# Patient Record
Sex: Female | Born: 1940
Health system: Southern US, Community
[De-identification: ages and names within clinical notes are randomized; demographics above are authoritative.]

## PROBLEM LIST (undated history)

## (undated) DIAGNOSIS — F32A Depression, unspecified: Secondary | ICD-10-CM

## (undated) DIAGNOSIS — K219 Gastro-esophageal reflux disease without esophagitis: Secondary | ICD-10-CM

## (undated) DIAGNOSIS — I10 Essential (primary) hypertension: Secondary | ICD-10-CM

## (undated) DIAGNOSIS — F329 Major depressive disorder, single episode, unspecified: Secondary | ICD-10-CM

## (undated) DIAGNOSIS — Z862 Personal history of diseases of the blood and blood-forming organs and certain disorders involving the immune mechanism: Secondary | ICD-10-CM

## (undated) DIAGNOSIS — E669 Obesity, unspecified: Secondary | ICD-10-CM

## (undated) DIAGNOSIS — E1165 Type 2 diabetes mellitus with hyperglycemia: Secondary | ICD-10-CM

## (undated) DIAGNOSIS — E7800 Pure hypercholesterolemia, unspecified: Secondary | ICD-10-CM

## (undated) DIAGNOSIS — K5790 Diverticulosis of intestine, part unspecified, without perforation or abscess without bleeding: Secondary | ICD-10-CM

## (undated) DIAGNOSIS — I493 Ventricular premature depolarization: Secondary | ICD-10-CM

## (undated) DIAGNOSIS — IMO0001 Reserved for inherently not codable concepts without codable children: Secondary | ICD-10-CM

## (undated) HISTORY — DX: Ventricular premature depolarization: I49.3

## (undated) HISTORY — PX: ABDOMINAL HYSTERECTOMY: SHX81

## (undated) HISTORY — DX: Depression, unspecified: F32.A

## (undated) HISTORY — PX: APPENDECTOMY: SHX54

## (undated) HISTORY — PX: CHOLECYSTECTOMY: SHX55

## (undated) HISTORY — DX: Essential (primary) hypertension: I10

## (undated) HISTORY — DX: Personal history of diseases of the blood and blood-forming organs and certain disorders involving the immune mechanism: Z86.2

## (undated) HISTORY — DX: Gastro-esophageal reflux disease without esophagitis: K21.9

## (undated) HISTORY — DX: Reserved for inherently not codable concepts without codable children: IMO0001

## (undated) HISTORY — DX: Obesity, unspecified: E66.9

## (undated) HISTORY — DX: Pure hypercholesterolemia, unspecified: E78.00

## (undated) HISTORY — DX: Diverticulosis of intestine, part unspecified, without perforation or abscess without bleeding: K57.90

## (undated) HISTORY — PX: OTHER SURGICAL HISTORY: SHX169

## (undated) HISTORY — DX: Major depressive disorder, single episode, unspecified: F32.9

## (undated) HISTORY — DX: Type 2 diabetes mellitus with hyperglycemia: E11.65

---

## 1999-03-29 ENCOUNTER — Ambulatory Visit (HOSPITAL_COMMUNITY): Admission: RE | Admit: 1999-03-29 | Discharge: 1999-03-29 | Payer: Self-pay | Admitting: Orthopedic Surgery

## 1999-04-27 ENCOUNTER — Ambulatory Visit (HOSPITAL_BASED_OUTPATIENT_CLINIC_OR_DEPARTMENT_OTHER): Admission: RE | Admit: 1999-04-27 | Discharge: 1999-04-27 | Payer: Self-pay | Admitting: Orthopedic Surgery

## 1999-06-18 ENCOUNTER — Other Ambulatory Visit: Admission: RE | Admit: 1999-06-18 | Discharge: 1999-06-18 | Payer: Self-pay | Admitting: Obstetrics and Gynecology

## 1999-07-03 ENCOUNTER — Encounter: Payer: Self-pay | Admitting: Cardiology

## 1999-07-03 ENCOUNTER — Encounter: Admission: RE | Admit: 1999-07-03 | Discharge: 1999-07-03 | Payer: Self-pay | Admitting: Cardiology

## 1999-07-11 ENCOUNTER — Encounter: Payer: Self-pay | Admitting: Gastroenterology

## 1999-07-11 ENCOUNTER — Ambulatory Visit (HOSPITAL_COMMUNITY): Admission: RE | Admit: 1999-07-11 | Discharge: 1999-07-11 | Payer: Self-pay | Admitting: Gastroenterology

## 1999-07-11 ENCOUNTER — Encounter: Payer: Self-pay | Admitting: Internal Medicine

## 1999-08-09 ENCOUNTER — Encounter: Payer: Self-pay | Admitting: Internal Medicine

## 2000-09-10 ENCOUNTER — Other Ambulatory Visit: Admission: RE | Admit: 2000-09-10 | Discharge: 2000-09-10 | Payer: Self-pay | Admitting: Obstetrics and Gynecology

## 2002-11-11 ENCOUNTER — Other Ambulatory Visit: Admission: RE | Admit: 2002-11-11 | Discharge: 2002-11-11 | Payer: Self-pay | Admitting: Obstetrics and Gynecology

## 2005-04-02 ENCOUNTER — Other Ambulatory Visit: Admission: RE | Admit: 2005-04-02 | Discharge: 2005-04-02 | Payer: Self-pay | Admitting: Obstetrics and Gynecology

## 2006-09-17 ENCOUNTER — Ambulatory Visit (HOSPITAL_BASED_OUTPATIENT_CLINIC_OR_DEPARTMENT_OTHER): Admission: RE | Admit: 2006-09-17 | Discharge: 2006-09-17 | Payer: Self-pay | Admitting: Orthopedic Surgery

## 2006-09-17 ENCOUNTER — Encounter (INDEPENDENT_AMBULATORY_CARE_PROVIDER_SITE_OTHER): Payer: Self-pay | Admitting: *Deleted

## 2008-08-11 LAB — HEMOGLOBIN A1C: Hgb A1c MFr Bld: 6.6 % — AB (ref 4.0–6.0)

## 2009-07-14 ENCOUNTER — Encounter (INDEPENDENT_AMBULATORY_CARE_PROVIDER_SITE_OTHER): Payer: Self-pay | Admitting: *Deleted

## 2009-12-08 DIAGNOSIS — I10 Essential (primary) hypertension: Secondary | ICD-10-CM | POA: Insufficient documentation

## 2009-12-08 DIAGNOSIS — K5730 Diverticulosis of large intestine without perforation or abscess without bleeding: Secondary | ICD-10-CM | POA: Insufficient documentation

## 2009-12-14 ENCOUNTER — Ambulatory Visit: Payer: Self-pay | Admitting: Internal Medicine

## 2010-01-24 ENCOUNTER — Ambulatory Visit: Payer: Self-pay | Admitting: Internal Medicine

## 2010-01-24 LAB — HM COLONOSCOPY

## 2010-01-25 ENCOUNTER — Encounter: Payer: Self-pay | Admitting: Internal Medicine

## 2010-04-05 ENCOUNTER — Ambulatory Visit: Payer: Self-pay | Admitting: Cardiology

## 2010-04-05 LAB — LIPID PANEL
Cholesterol: 167 mg/dL (ref 0–200)
HDL: 53 mg/dL (ref 35–70)
LDL Cholesterol: 89 mg/dL
Triglycerides: 123 mg/dL (ref 40–160)

## 2010-04-05 LAB — BASIC METABOLIC PANEL
BUN: 21 mg/dL (ref 4–21)
Glucose: 119 mg/dL

## 2010-04-06 ENCOUNTER — Encounter: Admission: RE | Admit: 2010-04-06 | Discharge: 2010-04-06 | Payer: Self-pay | Admitting: Cardiology

## 2010-07-16 ENCOUNTER — Encounter: Payer: Self-pay | Admitting: Cardiology

## 2010-07-16 DIAGNOSIS — Z862 Personal history of diseases of the blood and blood-forming organs and certain disorders involving the immune mechanism: Secondary | ICD-10-CM | POA: Insufficient documentation

## 2010-07-16 DIAGNOSIS — E1169 Type 2 diabetes mellitus with other specified complication: Secondary | ICD-10-CM | POA: Insufficient documentation

## 2010-07-16 DIAGNOSIS — E7800 Pure hypercholesterolemia, unspecified: Secondary | ICD-10-CM | POA: Insufficient documentation

## 2010-08-07 NOTE — Letter (Signed)
Summary: Patient [facility] Biopsy Results  [facility]  [address] [place], [place] [address]   Phone: [phone]  Fax: [phone]        January 25, 2010 MRN: [id number]    Elaine Oconnell [address] [place], [place]  [address]    Dear Ms. Christley,  I am pleased to inform you that the biopsies taken during your recent endoscopic examination did not show any evidence of cancer upon pathologic examination.  Additional information/recommendations:  __No further action is needed at this time.  Please follow-up with      your primary care physician for your other healthcare needs.  __ Please call [phone] to schedule a return visit to review      your condition.  _x_ Continue with the treatment plan as outlined on the day of your      exam.  _   Please call us if you are having persistent problems or have questions about your condition that have not been fully answered at this time.  Sincerely,  Hart Carwin MD  This letter has been electronically signed by your physician.  Appended Document: Patient Notice-Endo Biopsy Results letter mailed

## 2010-08-07 NOTE — Procedures (Signed)
Summary: Upper Endoscopy/[facility]  Upper Endoscopy/[facility]   Imported By: Sherian Rein 12/12/2009 11:47:14  _____________________________________________________________________  External Attachment:    Type:   Image     Comment:   External Document

## 2010-08-07 NOTE — Letter (Signed)
Summary: [facility] Instructions  [facility]  [address] [place], [place] [address]   Phone: [phone]  Fax: [phone]       KIMILA PAPALEO    08-05-1940    MRN: [id number]       Procedure Day /Date: 01/24/10 Wednesday     Arrival Time: 8:00 am     Procedure Time: 9:00 am     Location of Procedure:                    _x _  [facility] (4th Floor)  PREPARATION FOR COLONOSCOPY WITH MIRALAX  Starting 5 days prior to your procedure (01/19/10) do not eat nuts, seeds, popcorn, corn, beans, peas,  salads, or any raw vegetables.  Do not take any fiber supplements (e.g. Metamucil, Citrucel, and Benefiber). ____________________________________________________________________________________________________   THE DAY BEFORE YOUR PROCEDURE         DATE: 01/23/10 DAY: Tuesday  1   Drink clear liquids the entire day-NO SOLID FOOD  2   Do not drink anything colored red or purple.  Avoid juices with pulp.  No orange juice.  3   Drink at least 64 oz. (8 glasses) of fluid/clear liquids during the day to prevent dehydration and help the prep work efficiently.  CLEAR LIQUIDS INCLUDE: Water Jello Ice Popsicles Tea (sugar ok, no milk/cream) Powdered fruit flavored drinks Coffee (sugar ok, no milk/cream) Gatorade Juice: apple, white grape, white cranberry  Lemonade Clear bullion, consomm, broth Carbonated beverages (any kind) Strained chicken noodle soup Hard Candy  4   Mix the entire bottle of Miralax with 64 oz. of Gatorade/Powerade in the morning and put in the refrigerator to chill.  5   At 3:00 pm take 2 Dulcolax/Bisacodyl tablets.  6   At 4:30 pm take one Reglan/Metoclopramide tablet.  7  Starting at 5:00 pm drink one 8 oz glass of the Miralax mixture every 15-20 minutes until you have finished drinking the entire 64 oz.  You should finish drinking prep around 7:30 or 8:00 pm.  8   If you are nauseated, you may take the 2nd Reglan/Metoclopramide  tablet at 6:30 pm.        9    At 8:00 pm take 2 more DULCOLAX/Bisacodyl tablets.        THE DAY OF YOUR PROCEDURE      DATE:  01/24/10 DAY: Wednesday  You may drink clear liquids until 7:00 am  (2 HOURS BEFORE PROCEDURE).   MEDICATION INSTRUCTIONS  Unless otherwise instructed, you should take regular prescription medications with a small sip of water as early as possible the morning of your procedure.        OTHER INSTRUCTIONS  You will need a responsible adult at least 79 years of 72 years of age to accompany you and drive you home.   This person must remain in the waiting room during your procedure.  Wear loose fitting clothing that is easily removed.  Leave jewelry and other valuables at home.  However, you may wish to bring a book to read or an iPod/MP3 player to listen to music as you wait for your procedure to start.  Remove all body piercing jewelry and leave at home.  Total time from sign-in until discharge is approximately 2-3 hours.  You should go home directly after your procedure and rest.  You can resume normal activities the day after your procedure.  The day of your procedure you should not:   Drive   Make  legal decisions   Operate machinery   Drink alcohol   Return to work  You will receive specific instructions about eating, activities and medications before you leave.   The above instructions have been reviewed and explained to me by  Lamona Curl CMA Duncan Dull)  December 14, 2009 3:06 PM     I fully understand and can verbalize these instructions _____________________________ Date 12/14/09

## 2010-08-07 NOTE — Letter (Signed)
Summary: Patient Notice- Polyp Results  [facility]  [address] [place], [place] [address]   Phone: [phone]  Fax: [phone]        January 25, 2010 MRN: [id number]    KARELYN BRISBY [address] [place], [place]  [address]    Dear Ms. Freshour,  I am pleased to inform you that the colon polyp(s) removed during your recent colonoscopy was (were) found to be benign (no cancer detected) upon pathologic examination.There is no evidence of colitis.  I recommend you have a repeat colonoscopy examination in 10_ years to look for recurrent polyps, as having colon polyps increases your risk for having recurrent polyps or even colon cancer in the future.  Should you develop new or worsening symptoms of abdominal pain, bowel habit changes or bleeding from the rectum or bowels, please schedule an evaluation with either your primary care physician or with me.  Additional information/recommendations:  __x No further action with gastroenterology is needed at this time. Please      follow-up with your primary care physician for your other healthcare      needs.  __ Please call [phone] to schedule a return visit to review your      situation.  __ Please keep your follow-up visit as already scheduled.  _x_ Continue treatment plan as outlined the day of your exam.  Please call us if you are having persistent problems or have questions about your condition that have not been fully answered at this time.  Sincerely,  Hart Carwin MD  This letter has been electronically signed by your physician.  Appended Document: Patient Notice- Polyp Results letter mailed

## 2010-08-07 NOTE — Letter (Signed)
Summary: Diabetic Instructions  [facility]  [address] [place], [place] [address]   Phone: [phone]  Fax: [phone]    Elaine Oconnell 02/25/1941 MRN: [id number]   _ x _   ORAL DIABETIC MEDICATION INSTRUCTIONS  The day before your procedure:   Take your diabetic pill as you do normally  The day of your procedure:   Do not take your diabetic pill    We will check your blood sugar levels during the admission process and again in Recovery before discharging you home  ________________________________________________________________________

## 2010-08-07 NOTE — Letter (Signed)
Summary: Colonoscopy Letter  [facility]  [address] [place], [place] [address]   Phone: [phone]  Fax: [phone]      July 14, 2009 MRN: [id number]   ADYA WIRZ [address] [place], [place]  [address]   Dear Ms. Chia,   According to your medical record, it is time for you to schedule a Colonoscopy. The American Cancer Society recommends this procedure as a method to detect early colon cancer. Patients with a family history of colon cancer, or a personal history of colon polyps or inflammatory bowel disease are at increased risk.  This letter has beeen generated based on the recommendations made at the time of your procedure. If you feel that in your particular situation this may no longer apply, please contact our office.  Please call our office at [phone] to schedule this appointment or to update your records at your earliest convenience.  Thank you for cooperating with [place] to provide you with the very best care possible.   Sincerely,  Hedwig Morton. Juanda Chance, M.D.  [facility] [phone]

## 2010-08-07 NOTE — Progress Notes (Signed)
Summary: [facility]   Imported By: Sherian Rein 12/12/2009 11:44:51  _____________________________________________________________________  External Attachment:    Type:   Image     Comment:   External Document

## 2010-08-07 NOTE — Assessment & Plan Note (Signed)
Summary: DIARRHEA X 2 WKS...AS.   History of Present Illness Visit Type: Initial Visit Primary GI MD: Lina Sar MD Primary Provider: Cassell Clement, MD Chief Complaint: diarrhea on and off  History of Present Illness:   This is a 70 year old white female with acute diarrhea of 2 weeks duration getting much better in the last 2 weeks. The diarrhea started about a month ago and she was having watery stools 8-10 times a day without fever or rectal bleeding. She has irregular bowel habits, often having diarrhea after meals. A flexible sigmoidoscopy in 1994 for diverticulosis and a colonoscopy in 2001 confirmed the presence of diverticuli. She has a history of gastroesophageal reflux and mild gastritis on an upper endoscopy in February 2001. Her diarrhea has gotten better since taking Imodium. Other medical problems include hypertension and adult onset diabetes. She has been on metformin 1,000 mg once a day.   GI Review of Systems    Reports abdominal pain and  acid reflux.     Location of  Abdominal pain: lower abdomen.    Denies belching, bloating, chest pain, dysphagia with liquids, dysphagia with solids, heartburn, loss of appetite, nausea, vomiting, vomiting blood, weight loss, and  weight gain.      Reports diarrhea.     Denies anal fissure, black tarry stools, change in bowel habit, constipation, diverticulosis, fecal incontinence, heme positive stool, hemorrhoids, irritable bowel syndrome, jaundice, light color stool, liver problems, rectal bleeding, and  rectal pain. Preventive Screening-Counseling & Management  Alcohol-Tobacco     Smoking Status: never    Current Medications (verified): 1)  Lisinopril 10 Mg Tabs (Lisinopril) .Marland Kitchen.. 1 By Mouth Once Daily 2)  Metformin Hcl 1000 Mg Tabs (Metformin Hcl) .Marland Kitchen.. 1 By Mouth Once Daily 3)  B Complex 100  Tabs (B Complex Vitamins) .Marland Kitchen.. 1 By Mouth Once Daily 4)  Glucosamine Sulfate 1000 Mg Tabs (Glucosamine Sulfate) .... 2 By Mouth Once  Daily  Allergies (verified): 1)  ! Codeine  Past History:  Past Medical History: Reviewed history from 12/08/2009 and no changes required. Current Problems:  DIARRHEA (ICD-787.91) Hx of GASTRITIS, ANTRAL (ICD-535.40) HYPERTENSION (ICD-401.9) DIVERTICULOSIS OF COLON (ICD-562.10)    Past Surgical History: Appendectomy T & A Nasal Surgery Cholecystectomy Hysterectomy Lt. Arm Surgery Lt. wrist surgery Knee surgery  Family History: Reviewed history from 12/08/2009 and no changes required. No FH of Colon Cancer: Family History of Diabetes: Maternal Grandfather Family History of Heart Disease: Paternal Grandmother, Maternal Grandmother, Paternal Grandfather, Father  Social History: Reviewed history from 12/08/2009 and no changes required. Alcohol Use - no Illicit Drug Use - no Patient has never smoked.  Widowed  2 boys 1 girl Occupation: Retired  Smoking Status:  never  Review of Systems  The patient denies allergy/sinus, anemia, anxiety-new, arthritis/joint pain, back pain, blood in urine, breast changes/lumps, change in vision, confusion, cough, coughing up blood, depression-new, fainting, fatigue, fever, headaches-new, hearing problems, heart murmur, heart rhythm changes, itching, muscle pains/cramps, night sweats, nosebleeds, shortness of breath, skin rash, sleeping problems, sore throat, swelling of feet/legs, swollen lymph glands, thirst - excessive, urination - excessive, urination changes/pain, urine leakage, vision changes, and voice change.         Pertinent positive and negative review of systems were noted in the above HPI. All other ROS was otherwise negative.   Vital Signs:  Patient profile:   70 year old female Height:      63 inches Weight:      209.19 pounds BMI:  37.19 Pulse rate:   88 / minute Pulse rhythm:   regular BP sitting:   118 / 80  (left arm)  Vitals Entered By: Milford Cage NCMA (December 14, 2009 2:29 PM)  Physical Exam  General:   overweight, alert and oriented. Eyes:  nonicteric. Mouth:  normal mucosa. Neck:  Supple; no masses or thyromegaly. Lungs:  Clear throughout to auscultation. Heart:  Regular rate and rhythm; no murmurs, rubs,  or bruits. Abdomen:  soft, obese abdomen with normal active bowel sounds. Tenderness in left upper quadrant and left middle quadrant. No distention, liver edge at costal margin, post cholecystectomy scar in right upper quadrant. Rectal:  normal rectal tone with small hemorrhoidal tags externally. Stool is soft and Hemoccult-negative. Extremities:  no edema.   Impression & Recommendations:  Problem # 1:  DIARRHEA (ICD-787.91) Patient has diarrhea predominant irritable bowel syndrome with an acute exacerbation of diarrhea possibly related to infectious colitis. We need to rule out metformin related diarrhea or post cholecystectomy diarrhea. She is doing better without any  treatment. She is due for a screening colonoscopy. We will proceed with a colonoscopy and biopsies to rule out microscopic colitis. She may start on Metamucil, 1 heaping teaspoon daily. She did not want to use antispasmodics.  Problem # 2:  Hx of GASTRITIS, ANTRAL (ICD-535.40)  Patient had gastritis as per her last upper endoscopy in 2001. She continues to have gastroesophageal reflux and food intolerance. We will proceed with an upper endoscopy at the time of colonoscopy.  Orders: Colon/Endo (Colon/Endo)  Patient Instructions: 1)  upper endoscopy and colonoscopy with biopsies to rule out microscopic colitis. 2)  Over-the-counter acid reducers as per patient's request. 3)  Antireflux measures. 4)  Pepto-Bismol p.r.n. diarrhea. 5)  Metamucil one heaping teaspoon daily. 6)  Copy sent to : Dr Wylene Simmer 7)  The medication list was reviewed and reconciled.  All changed / newly prescribed medications were explained.  A complete medication list was provided to the patient / caregiver. Prescriptions: DULCOLAX 5 MG   TBEC (BISACODYL) Day before procedure take 2 at 3pm and 2 at 8pm.  #4 x 0   Entered by:   Lamona Curl CMA (AAMA)   Authorized by:   Hart Carwin MD   Signed by:   Lamona Curl CMA (AAMA) on 12/14/2009   Method used:   Electronically to        [facility]  [phone]* (retail)       [address]       [place], [place]  [address]       Ph: [phone] or [phone]       Fax: [phone]   RxID:   [id number] REGLAN 10 MG  TABS (METOCLOPRAMIDE HCL) As per prep instructions.  #2 x 0   Entered by:   Lamona Curl CMA (AAMA)   Authorized by:   Hart Carwin MD   Signed by:   Lamona Curl CMA (AAMA) on 12/14/2009   Method used:   Electronically to        [facility]  [phone]* (retail)       [address]       [place], [place]  [address]       Ph: [phone] or [phone]       Fax: [phone]   RxID:   [id number] MIRALAX   POWD (POLYETHYLENE GLYCOL 3350) As per  prep  instructions.  #255gm x 0   Entered by:   Lamona Curl CMA (AAMA)   Authorized by:   Hart Carwin MD   Signed by:   Lamona Curl CMA (AAMA) on 12/14/2009   Method used:   Electronically to        [facility]  [phone]* (retail)       [address]       [place], [place]  [address]       Ph: [phone] or [phone]       Fax: [phone]   RxID:   [id number]

## 2010-08-07 NOTE — Procedures (Signed)
Summary: Upper Endoscopy  Patient: Elaine Oconnell Note: All result statuses are Final unless otherwise noted.  Tests: (1) Upper Endoscopy (EGD)   EGD Upper Endoscopy       DONE     [facility]     [address]     [place], [place]  [address]           ENDOSCOPY PROCEDURE REPORT           PATIENT:  [name], [name]  MR#:  [id number]     BIRTHDATE:  1940/08/23, 69 yrs. old  GENDER:  female           ENDOSCOPIST:  Hedwig Morton. Juanda Chance, MD     Referred by:  Cassell Clement, M.D.           PROCEDURE DATE:  01/24/2010     PROCEDURE:  EGD with biopsy     ASA CLASS:  Class II     INDICATIONS:  GERD hx gastritis on EGD 2001           MEDICATIONS:   Versed 4 mg, Fentanyl 25 mcg     TOPICAL ANESTHETIC:  Exactacain Spray           DESCRIPTION OF PROCEDURE:   After the risks benefits and     alternatives of the procedure were thoroughly explained, informed     consent was obtained.  The  endoscope was introduced through the     mouth and advanced to the second portion of the duodenum, without     limitations.  The instrument was slowly withdrawn as the mucosa     was fully examined.     <<PROCEDUREIMAGES>>           Mild gastritis was found. With standard forceps, a biopsy was     obtained and sent to pathology (see image1 and image3).  A hiatal     hernia was found (see image6). 1-2 cm small hiatal hernia,     reducible  Otherwise the examination was normal (see image4 and     image2).    Retroflexed views revealed no abnormalities.    The     scope was then withdrawn from the patient and the procedure     completed.           COMPLICATIONS:  None           ENDOSCOPIC IMPRESSION:     1) Mild gastritis     2) Hiatal hernia     3) Otherwise normal examination     RECOMMENDATIONS:     1) Await biopsy results     2) Anti-reflux regimen to be follow     OTC Pepcid 40 mg po qd           REPEAT EXAM:  In 0 year(s) for.           ______________________________     Hedwig Morton.  Juanda Chance, MD           CC:           n.     eSIGNED:   Hedwig Morton. Shivangi Lutz at 01/24/2010 10:45 AM           Irwin Brakeman, [id number]  Note: An exclamation mark (!) indicates a result that was not dispersed into the flowsheet. Document Creation Date: 01/24/2010 10:46 AM _______________________________________________________________________  (1) Order result status: Final Collection or observation date-time: 01/24/2010 10:12 Requested date-time:  Receipt date-time:  Reported date-time:  Referring Physician:  Ordering Physician: Lina Sar [phone]) Specimen Source:  Source: Launa Grill Order Number: [id number] Lab site:

## 2010-08-07 NOTE — Procedures (Signed)
Summary: Colonoscopy  Patient: Elaine Oconnell Note: All result statuses are Final unless otherwise noted.  Tests: (1) Colonoscopy (COL)   COL Colonoscopy           DONE     [facility]     [address]     [place], [place]  [address]           COLONOSCOPY PROCEDURE REPORT           PATIENT:  Elaine Oconnell, Elaine Oconnell  MR#:  [id number]     BIRTHDATE:  03/12/1941, 69 yrs. old  GENDER:  female     ENDOSCOPIST:  Hedwig Morton. Juanda Chance, MD     REF. BY:  Cassell Clement, M.D.     PROCEDURE DATE:  01/24/2010     PROCEDURE:  Colonoscopy 10272     ASA CLASS:  Class II     INDICATIONS:  unexplained diarrhea diabetic, on Metformin, s/p     acute disrrheal ill ness, now improved     last colon 2001     MEDICATIONS:   Versed 5 mg, Fentanyl 50 mcg           DESCRIPTION OF PROCEDURE:   After the risks benefits and     alternatives of the procedure were thoroughly explained, informed     consent was obtained.  Digital rectal exam was performed and     revealed no rectal masses.   The  endoscope was introduced through     the anus and advanced to the cecum, which was identified by both     the appendix and ileocecal valve, without limitations.  The     quality of the prep was good, using MiraLax.  The instrument was     then slowly withdrawn as the colon was fully examined.     <<PROCEDUREIMAGES>>           FINDINGS:  Moderate diverticulosis was found throughout the colon     (see image1, image5, image6, and image7). multiple diverticuli     throughout the colon  A diminutive polyp was found. 3 mm     diminutive polyp at 20 cm The polyp was removed using cold biopsy     forceps (see image8).  This was otherwise a normal examination of     the colon. Random biopsies were obtained and sent to pathology     (see image9, image3, and image2). r/o microscpic colitis     Retroflexed views in the rectum revealed no abnormalities.    The     scope was then withdrawn from the patient and the procedure  completed.           COMPLICATIONS:  None     ENDOSCOPIC IMPRESSION:     1) Moderate diverticulosis throughout the colon     2) Diminutive polyp     3) Otherwise normal examination     RECOMMENDATIONS:     1) Await pathology results     Imodium 1 po prn diarrhea     REPEAT EXAM:  In 10 year(s) for.           ______________________________     Hedwig Morton. Juanda Chance, MD           CC:           n.     eSIGNED:   Hedwig Morton. Gustavus Haskin at 01/24/2010 10:40 AM           Irwin Brakeman, [id number]  Note: An exclamation mark Marland Kitchen)  indicates a result that was not dispersed into the flowsheet. Document Creation Date: 01/24/2010 10:41 AM _______________________________________________________________________  (1) Order result status: Final Collection or observation date-time: 01/24/2010 10:28 Requested date-time:  Receipt date-time:  Reported date-time:  Referring Physician:   Ordering Physician: Lina Sar [phone]) Specimen Source:  Source: Launa Grill Order Number: [id number] Lab site:   Appended Document: Colonoscopy no recall EGD colon recall 10 years  Appended Document: Colonoscopy     Procedures Next Due Date:    Colonoscopy: 02/2020

## 2010-08-08 ENCOUNTER — Ambulatory Visit (INDEPENDENT_AMBULATORY_CARE_PROVIDER_SITE_OTHER): Payer: Medicare Other | Admitting: Cardiology

## 2010-08-08 ENCOUNTER — Other Ambulatory Visit: Payer: Self-pay

## 2010-08-08 DIAGNOSIS — I119 Hypertensive heart disease without heart failure: Secondary | ICD-10-CM

## 2010-08-08 DIAGNOSIS — E7800 Pure hypercholesterolemia, unspecified: Secondary | ICD-10-CM

## 2010-08-08 DIAGNOSIS — E119 Type 2 diabetes mellitus without complications: Secondary | ICD-10-CM

## 2010-09-22 LAB — GLUCOSE, CAPILLARY
Glucose-Capillary: 118 mg/dL — ABNORMAL HIGH (ref 70–99)
Glucose-Capillary: 135 mg/dL — ABNORMAL HIGH (ref 70–99)

## 2010-11-05 ENCOUNTER — Encounter: Payer: Self-pay | Admitting: Cardiovascular Disease

## 2010-11-23 NOTE — Op Note (Signed)
Elaine Oconnell, Elaine Oconnell              ACCOUNT NO.:  [id number]   MEDICAL RECORD NO.:  [id number]          PATIENT TYPE:  AMB   LOCATION:  DSC                          FACILITY:  [facility]   PHYSICIAN:  Artist Pais. Weingold, M.D.DATE OF BIRTH:  08-16-1940   DATE OF PROCEDURE:  09/17/2006  DATE OF DISCHARGE:                               OPERATIVE REPORT   PREOPERATIVE DIAGNOSIS:  Left shoulder rotator cuff tear impingement  syndrome and left arm mass.   POSTOPERATIVE DIAGNOSIS:  Left shoulder rotator cuff tear impingement  syndrome and left arm mass.   PROCEDURE:  1. Excisional biopsy left arm mass.  2. Injection subacromial space, left shoulder.   SURGEON:  Artist Pais. Mina Marble, M.D.   ASSISTANT:  None.   ANESTHESIA:  General.   TOURNIQUET TIME:  None.   COMPLICATIONS:  None.   DRAINS:  None.   SPECIMENS:  One sent.   OPERATIVE REPORT:  The patient was taken to the operating suite. After  the induction of adequate general anesthesia, the left upper extremity  was prepped and draped in the usual sterile fashion.  Prior to this, a  combination of Celestone and Marcaine 2 to 1 was injected in the  subacromial space of the left shoulder using sterile technique. After  this was done, the left upper extremity was prepped and draped in the  usual sterile fashion.  An incision was then made over the anterior  aspect of the arm near the biceps.  The skin was incised for 5 cm.  The  dissection was carried down to the fascia overlying the biceps muscle  where a lipomatous mass was removed.  The wound was irrigated.  Hemostasis was achieved with bipolar cautery.  It was loosely closed  with 3-0 Prolene subcuticular stitch.  Steri-Strips, 4x4s, fluffs and a  compressive dressing was applied.  The patient had 0.25% plain Marcaine  injected for postop pain control.  The patient tolerated the procedure  well and went to the recovery room in stable condition.      Artist Pais Mina Marble,  M.D.  Electronically Signed    MAW/MEDQ  D:  09/17/2006  T:  09/18/2006  Job:  [id number]

## 2010-12-04 ENCOUNTER — Telehealth: Payer: Self-pay | Admitting: Cardiology

## 2010-12-04 NOTE — Telephone Encounter (Signed)
Returned call to pt after reviewing pt concerns/complaints with Dr Patty Sermons. Advised pt to try Mucinex OTC 600mg  Bid and drink plenty of water. Pt verbalizes understanding. Pt also wanting to schedule 4 month ov.

## 2010-12-04 NOTE — Telephone Encounter (Signed)
PT SAID WENT TO [facility] FOR SORE THROAT, FEVER. THEY TOLD HER TO FOLLOW UP WITH HER CARDIO. THINKS SHE NEEDS TO BE SEEN. CHART IN BOX.

## 2010-12-11 ENCOUNTER — Other Ambulatory Visit: Payer: Self-pay | Admitting: *Deleted

## 2010-12-11 DIAGNOSIS — E119 Type 2 diabetes mellitus without complications: Secondary | ICD-10-CM

## 2010-12-11 DIAGNOSIS — E785 Hyperlipidemia, unspecified: Secondary | ICD-10-CM

## 2010-12-12 ENCOUNTER — Encounter: Payer: Self-pay | Admitting: Cardiology

## 2010-12-12 ENCOUNTER — Telehealth: Payer: Self-pay | Admitting: Cardiology

## 2010-12-12 ENCOUNTER — Other Ambulatory Visit (INDEPENDENT_AMBULATORY_CARE_PROVIDER_SITE_OTHER): Payer: Medicare Other | Admitting: *Deleted

## 2010-12-12 ENCOUNTER — Ambulatory Visit (INDEPENDENT_AMBULATORY_CARE_PROVIDER_SITE_OTHER): Payer: Medicare Other | Admitting: Cardiology

## 2010-12-12 VITALS — BP 130/82 | HR 80 | Ht 63.0 in | Wt 203.0 lb

## 2010-12-12 DIAGNOSIS — E785 Hyperlipidemia, unspecified: Secondary | ICD-10-CM

## 2010-12-12 DIAGNOSIS — J209 Acute bronchitis, unspecified: Secondary | ICD-10-CM

## 2010-12-12 DIAGNOSIS — E119 Type 2 diabetes mellitus without complications: Secondary | ICD-10-CM

## 2010-12-12 LAB — LIPID PANEL
HDL: 51.6 mg/dL (ref >39.00)
LDL Cholesterol: 119 mg/dL — ABNORMAL HIGH (ref 0–99)
Total CHOL/HDL Ratio: 4
Triglycerides: 126 mg/dL (ref 0.0–149.0)

## 2010-12-12 LAB — HEPATIC FUNCTION PANEL
Bilirubin, Direct: 0.1 mg/dL (ref 0.0–0.3)
Total Bilirubin: 0.6 mg/dL (ref 0.3–1.2)

## 2010-12-12 LAB — BASIC METABOLIC PANEL
Calcium: 9.2 mg/dL (ref 8.4–10.5)
Creatinine, Ser: 0.9 mg/dL (ref 0.4–1.2)
GFR: 66.25 mL/min (ref >60.00)

## 2010-12-12 MED ORDER — AZITHROMYCIN 250 MG PO TABS: ORAL_TABLET | ORAL | Status: AC

## 2010-12-12 NOTE — Telephone Encounter (Signed)
Printed instead of EMR at office visit, called to pharmacy

## 2010-12-12 NOTE — Assessment & Plan Note (Signed)
The patient has not felt well for several weeks.  He's had symptoms of cough with initially yellowish sputum.  She went to her primary care and was given a course of amoxicillin and did not improve and went back the next day and was given a Z-Pak.  She finished a Z-Pak a week ago and was slightly improved but now feels that she is deteriorating again with worsening cough.  She's not had any hemoptysis or pleuritic chest pain.  She's not having any fever or chills or night sweats.  Her cough is keeping her awake however.

## 2010-12-12 NOTE — Telephone Encounter (Signed)
Called in wanting a refill of [facility] [address]. Please tell the pharmacy to call her when the prescription is filled.

## 2010-12-12 NOTE — Assessment & Plan Note (Signed)
The patient has a history of Diabetes mellitus and exogenous obesity.  She's not having any hypoglycemic episodes .She has not been successful in losing weight.

## 2010-12-12 NOTE — Progress Notes (Signed)
Elaine Oconnell Date of Birth:  06/04/1941 [facility] / [facility] [address]   [address] [place], [place]  [address] [phone]           Fax   [phone]  History of Present Illness: This 70 year old woman is seen for a scheduled followup office visit.  She has a complex past medical history.  She has a history of sarcoidosis.  She has hypertension and hypercholesterolemia and exogenous obesity and is diabetic.  For the past several weeks she has had a bad head and chest cold with wheezing consistent with bronchitis.  She's had one round of Zithromax with partialImprovement.   her sarcoidosis has been in active.  Current Outpatient Prescriptions  Medication Sig Dispense Refill  . aspirin 325 MG tablet Take 325 mg by mouth daily.       Marland Kitchen lisinopril (PRINIVIL,ZESTRIL) 10 MG tablet Take 10 mg by mouth daily.        . metFORMIN (GLUMETZA) 1000 MG (MOD) 24 hr tablet Take 1,000 mg by mouth daily.        Marland Kitchen azithromycin (ZITHROMAX) 250 MG tablet Take 2 tablets by mouth on day 1, followed by 1 tablet by mouth daily for 4 days. Take 2 tablets (500 mg) on  Day 1,  followed by 1 tablet (250 mg) once daily on Days 2 through 5.  6 each  0  . DISCONTD: glucosamine-chondroitin 500-400 MG tablet Take 1 tablet by mouth daily.        Marland Kitchen DISCONTD: hydrocortisone 1 % cream Apply topically 2 (two) times daily.        Marland Kitchen DISCONTD: vitamin B-12 (CYANOCOBALAMIN) 100 MCG tablet Take 50 mcg by mouth daily.          Allergies  Allergen Reactions  . Codeine     REACTION: nausea  . Lipitor (Atorvastatin Calcium)   . Naprosyn (Naproxen)   . Polysorbate 80-Sertraline     Patient Active Problem List  Diagnoses  . HYPERTENSION  . GASTRITIS, ANTRAL  . DIVERTICULOSIS OF COLON  . DIARRHEA  . Personal history of sarcoidosis  . Hypertension  . Diabetes mellitus  . Hypercholesterolemia  . Acute bronchitis    History  Smoking status  . Never Smoker   Smokeless tobacco  . Not on file     History  Alcohol Use No    Family History  Problem Relation Age of Onset  . Hypertension Mother   . Heart disease Father   . Hyperlipidemia Father     Review of Systems: Constitutional: no fever chills diaphoresis or fatigue or change in weight.  Head and neck: no hearing loss, no epistaxis, no photophobia or visual disturbance. Respiratory: No cough, shortness of breath or wheezing. Cardiovascular: No chest pain peripheral edema, palpitations. Gastrointestinal: No abdominal distention, no abdominal pain, no change in bowel habits hematochezia or melena. Genitourinary: No dysuria, no frequency, no urgency, no nocturia. Musculoskeletal:No arthralgias, no back pain, no gait disturbance or myalgias. Neurological: No dizziness, no headaches, no numbness, no seizures, no syncope, no weakness, no tremors. Hematologic: No lymphadenopathy, no easy bruising. Psychiatric: No confusion, no hallucinations, no sleep disturbance.    Physical Exam: Filed Vitals:   12/12/10 1146  BP: 130/82  Pulse: 80  The general appearance reveals a large woman in no acute distress.  She does have a deep rattling cough.Pupils equal and reactive.   Extraocular Movements are full.  There is no scleral icterus.  The mouth and pharynx are normal.  The neck is supple.  The carotids reveal no bruits.  The jugular venous pressure is normal.  The thyroid is not enlarged.  There is no lymphadenopathy.The chest is clear to percussion and auscultation. There are no rales or rhonchi. Expansion of the chest is symmetrical.  She does have some expiratory wheezes when she lies down.The precordium is quiet.  The first heart sound is normal.  The second heart sound is physiologically split.  There is no murmur gallop rub or click.  There is no abnormal lift or heave.The abdomen is soft and nontender. Bowel sounds are normal. The liver and spleen are not enlarged. There Are no abdominal masses. There are no bruits.The pedal  pulses are good.  There is no phlebitis or edema.  There is no cyanosis or clubbing.Strength is normal and symmetrical in all extremities.  There is no lateralizing weakness.  There are no sensory deficits.The skin is warm and dry.  There is no rash.   Assessment / Plan: We gave her another prescription for Z-Pak and she needs to get back on Mucinex twice a day.

## 2010-12-14 ENCOUNTER — Telehealth: Payer: Self-pay | Admitting: *Deleted

## 2010-12-14 NOTE — Progress Notes (Signed)
advised

## 2010-12-14 NOTE — Telephone Encounter (Signed)
Agree with plan 

## 2010-12-14 NOTE — Telephone Encounter (Signed)
When I called and advised of labs, patient stated she was getting a cortisone injection in her knee today.  Wanted to know if she should increase her metformin.  Advised to increase metformin to 1000 mg in am and 500 mg at supper for 1 week after injection.  Verbalized understanding.

## 2010-12-14 NOTE — Telephone Encounter (Signed)
Message copied by Burnell Blanks on Fri Dec 14, 2010 12:52 PM ------      Message from: Cassell Clement      Created: Wed Dec 12, 2010  5:02 PM       Please  Report.The chemistries are good.  The blood sugar is slightly high at129.The hemoglobin A1c is higher at 7.0   The LDL cholesterol is still slightly elevated.  She needs to work harder on diet.  Continue same meds

## 2011-01-21 ENCOUNTER — Encounter: Payer: Self-pay | Admitting: Cardiology

## 2011-02-06 HISTORY — PX: KNEE SURGERY: SHX244

## 2011-02-08 ENCOUNTER — Other Ambulatory Visit: Payer: Self-pay | Admitting: Cardiology

## 2011-02-08 NOTE — Telephone Encounter (Signed)
Pt called she wants refill lisinopril she has no pills left please call to [facility] on [address]

## 2011-02-08 NOTE — Telephone Encounter (Signed)
escribe medication per fax request  

## 2011-02-08 NOTE — Telephone Encounter (Signed)
Requesting refill on Lisinopril. Advised was sent this am

## 2011-03-01 ENCOUNTER — Ambulatory Visit (HOSPITAL_BASED_OUTPATIENT_CLINIC_OR_DEPARTMENT_OTHER)
Admission: RE | Admit: 2011-03-01 | Discharge: 2011-03-01 | Disposition: A | Discharge status: Home / Self Care | Payer: Medicare Other | Source: Ambulatory Visit | Attending: Orthopedic Surgery | Admitting: Orthopedic Surgery

## 2011-03-01 DIAGNOSIS — IMO0002 Reserved for concepts with insufficient information to code with codable children: Secondary | ICD-10-CM | POA: Insufficient documentation

## 2011-03-01 DIAGNOSIS — Z01812 Encounter for preprocedural laboratory examination: Secondary | ICD-10-CM | POA: Insufficient documentation

## 2011-03-01 DIAGNOSIS — Z01810 Encounter for preprocedural cardiovascular examination: Secondary | ICD-10-CM | POA: Insufficient documentation

## 2011-03-01 DIAGNOSIS — M2240 Chondromalacia patellae, unspecified knee: Secondary | ICD-10-CM | POA: Insufficient documentation

## 2011-03-01 DIAGNOSIS — X58XXXA Exposure to other specified factors, initial encounter: Secondary | ICD-10-CM | POA: Insufficient documentation

## 2011-03-01 LAB — GLUCOSE, CAPILLARY: Glucose-Capillary: 129 mg/dL — ABNORMAL HIGH (ref 70–99)

## 2011-03-01 LAB — POCT I-STAT 4, (NA,K, GLUC, HGB,HCT): Glucose, Bld: 112 mg/dL — ABNORMAL HIGH (ref 70–99)

## 2011-03-07 NOTE — Op Note (Signed)
Elaine Oconnell, Elaine Oconnell NO.:  [id number]  MEDICAL RECORD NO.:  [id number]  LOCATION:                                 FACILITY:  PHYSICIAN:  Ollen Gross, M.D.    DATE OF BIRTH:  09/30/1940  DATE OF PROCEDURE:  03/01/2011 DATE OF DISCHARGE:                              OPERATIVE REPORT   __________ ADT??  PREOPERATIVE DIAGNOSIS:  Left knee medial meniscal tears.  POSTOPERATIVE DIAGNOSES:  Left knee medial meniscal tears and chondral defect.  PROCEDURE:  Left knee arthroscopy with meniscal debridement and chondroplasty.  SURGEON:  Ollen Gross, M.D.  ASSISTANT:  None.  ANESTHESIA:  General.  ESTIMATED BLOOD LOSS:  Minimal.  DRAINS:  None.  COMPLICATIONS:  None.  CONDITION:  Stable to recovery room.  CLINICAL NOTE:  Ms. Elaine Oconnell is a 70 year old female with a long history of significant left knee pain and mechanical symptoms.  Exam and history suggested a meniscal tear.  She did have some early arthritis, but nowhere near bone-on-bone.  Injection was attempted, which did not help. She had then an MRI that showed a meniscal tear.  She presents now for arthroscopy and debridement.  PROCEDURE IN DETAIL:  After successful administration of general anesthetic, tourniquet was placed on the left thigh, and left lower extremity prepped and draped in the usual sterile fashion.  Standard superomedial and inferolateral incisions were made, inflow cannula passed superomedial cannula passed inferolateral.  Arthroscopic visualization proceeds.  Undersurface of patella and trochlea had some mild chondromalacia, but no full-thickness cartilage defects and no unstable cartilage.  There is a lot of synovitis in the suprapatellar area.  Medial and lateral gutters were visualized and there is a loose body in the medial gutter.  Flexion valgus force was applied to the knee and the medial compartment entered.  Spinal needle was used to localize the inferomedial  portal.  A small incision was made and dilator placed. The grabber was used to remove this large loose body.  The medial compartment was then inspected.  There was chondral defect of the medial femoral condyle as well as the medial meniscal tear.  The portal I used to remove the loose body was based too far medially to get good access to medial compartment, so I made another small incision just about a centimeter central to that, which allowed for better access into the medial compartment.  The meniscus was debrided back to a stable base with baskets and a 4.2-mm shaver, and sealed off with the ArthroCare. The chondral defect and the medial femoral condyle was debrided back to a stable base with the shaver.  It was about 1 x 1 cm area. Intercondylar notch was visualized.  The ACL was intact.  The lateral compartment was entered.  There was some grade 2 chondromalacia, but no unstable cartilage or full-thickness defects.  The ArthroCare was then used to debride the hypertrophic synovium in the suprapatellar area and everything was again inspected.  No other tears, defects, or loose bodies were noted.  The arthroscopic equipment was now removed from the inferior portals, which were closed with interrupted 4-0 nylon.  20 mL of 0.25% Marcaine with epi was injected through the  inflow cannula and that was removed and that portal closed with nylon.  The incision was cleaned and dried and a bulky sterile dressing applied.  She was then awakened and transported to recovery in stable condition.     Ollen Gross, M.D.     FA/MEDQ  D:  03/01/2011  T:  03/01/2011  Job:  [id number]  Electronically Signed by Ollen Gross M.D. on 03/07/2011 04:54:09 PM

## 2011-03-20 ENCOUNTER — Ambulatory Visit: Payer: Medicare Other | Admitting: Cardiology

## 2011-05-17 ENCOUNTER — Other Ambulatory Visit: Payer: Self-pay | Admitting: *Deleted

## 2011-05-17 DIAGNOSIS — I119 Hypertensive heart disease without heart failure: Secondary | ICD-10-CM

## 2011-05-20 ENCOUNTER — Encounter: Payer: Self-pay | Admitting: Cardiology

## 2011-05-20 ENCOUNTER — Ambulatory Visit (INDEPENDENT_AMBULATORY_CARE_PROVIDER_SITE_OTHER): Payer: Medicare Other | Admitting: Cardiology

## 2011-05-20 ENCOUNTER — Other Ambulatory Visit: Payer: Medicare Other | Admitting: *Deleted

## 2011-05-20 DIAGNOSIS — I119 Hypertensive heart disease without heart failure: Secondary | ICD-10-CM

## 2011-05-20 DIAGNOSIS — E119 Type 2 diabetes mellitus without complications: Secondary | ICD-10-CM

## 2011-05-20 DIAGNOSIS — R002 Palpitations: Secondary | ICD-10-CM

## 2011-05-20 DIAGNOSIS — I493 Ventricular premature depolarization: Secondary | ICD-10-CM

## 2011-05-20 DIAGNOSIS — I4949 Other premature depolarization: Secondary | ICD-10-CM

## 2011-05-20 DIAGNOSIS — I10 Essential (primary) hypertension: Secondary | ICD-10-CM

## 2011-05-20 NOTE — Assessment & Plan Note (Signed)
The patient has not been experiencing any chest pain or symptoms of congestive heart failure.

## 2011-05-20 NOTE — Assessment & Plan Note (Signed)
The patient has not been to careful with her diet.  She has been eating a lot of biscuits and also mayonnaise.  He has not been having any hypoglycemic symptoms

## 2011-05-20 NOTE — Progress Notes (Signed)
Elaine Oconnell Date of Birth:  1941-04-22 [facility] / [facility] [address]   [address] [place], [place]  [address] [phone]           Fax   [phone]  History of Present Illness: This pleasant 70 year old woman is seen for a four-month followup office visit.  She has a complex past medical history.  She has a history of hypertension and hypercholesterolemia.  She is diabetic.  She has a past history of inactive sarcoidosis.  Has osteoarthritis and since we last saw her has had a left arthroscopy with improvement in her symptoms.  Unfortunately she subsequently fell on that knee and is still having some difficulty.  Current Outpatient Prescriptions  Medication Sig Dispense Refill  . acidophilus (RISAQUAD) CAPS Take 1 capsule by mouth daily.        Marland Kitchen aspirin 325 MG tablet Take 325 mg by mouth daily.       Marland Kitchen b complex vitamins capsule Take 1 capsule by mouth daily.        Marland Kitchen lisinopril (PRINIVIL,ZESTRIL) 10 MG tablet TAKE ONE TABLET BY MOUTH EVERY DAY  90 tablet  11  . meloxicam (MOBIC) 15 MG tablet Take 15 mg by mouth daily.       . metFORMIN (GLUMETZA) 1000 MG (MOD) 24 hr tablet Take 1,000 mg by mouth daily.          Allergies  Allergen Reactions  . Codeine     REACTION: nausea  . Lipitor (Atorvastatin Calcium)   . Naprosyn (Naproxen)   . Polysorbate 80-Sertraline     Patient Active Problem List  Diagnoses  . HYPERTENSION  . GASTRITIS, ANTRAL  . DIVERTICULOSIS OF COLON  . DIARRHEA  . Personal history of sarcoidosis  . Hypertension  . Diabetes mellitus  . Hypercholesterolemia  . Acute bronchitis    History  Smoking status  . Never Smoker   Smokeless tobacco  . Not on file    History  Alcohol Use No    Family History  Problem Relation Age of Onset  . Hypertension Mother   . Heart disease Father   . Hyperlipidemia Father     Review of Systems: Constitutional: no fever chills diaphoresis or fatigue or change in weight.  Head and  neck: no hearing loss, no epistaxis, no photophobia or visual disturbance. Respiratory: No cough, shortness of breath or wheezing. Cardiovascular: No chest pain peripheral edema, palpitations. Gastrointestinal: No abdominal distention, no abdominal pain, no change in bowel habits hematochezia or melena. Genitourinary: No dysuria, no frequency, no urgency, no nocturia. Musculoskeletal:No arthralgias, no back pain, no gait disturbance or myalgias. Neurological: No dizziness, no headaches, no numbness, no seizures, no syncope, no weakness, no tremors. Hematologic: No lymphadenopathy, no easy bruising. Psychiatric: No confusion, no hallucinations, no sleep disturbance.    Physical Exam: Filed Vitals:   05/20/11 1034  BP: 124/88  Pulse: 78  The patient appears to be in no distress.  Head and neck exam reveals that the pupils are equal and reactive.  The extraocular movements are full.  There is no scleral icterus.  Mouth and pharynx are benign.  No lymphadenopathy.  No carotid bruits.  The jugular venous pressure is normal.  Thyroid is not enlarged or tender.  Chest is clear to percussion and auscultation.  No rales or rhonchi.  Expansion of the chest is symmetrical.  Heart reveals no abnormal lift or heave.  The pulse is slightly irregular.  First and second heart sounds are  normal.  There is no murmur gallop rub or click.  The breasts reveal no masses.  The abdomen is soft and nontender.  Bowel sounds are normoactive.  There is no hepatosplenomegaly or mass.  There are no abdominal bruits.  Extremities reveal no phlebitis or edema.  Pedal pulses are good.  There is no cyanosis or clubbing.  Neurologic exam is normal strength and no lateralizing weakness.  No sensory deficits.  Integument reveals no rash  EKG today shows normal sinus rhythm with occasional unifocal PVCs.  No ischemic changes.  There is left axis deviation.  Assessment / Plan:  Continue same medication.  Work harder  on diet and weight loss.  Recheck in 4 months for office visit and fasting lab work

## 2011-05-20 NOTE — Patient Instructions (Addendum)
Will obtain labs today and call with the results Your physician recommends that you continue on your current medications as directed. Please refer to the Current Medication list given to you today. Your physician recommends that you schedule a follow-up appointment in: 4 months with fasting labs(lp/bmet/hfp)

## 2011-05-20 NOTE — Assessment & Plan Note (Signed)
The patient has been noticing occasional palpitations.  EKG today shows unifocal PVCs.  He does require no specific treatment at this time.  We are checking electrolytes today.

## 2011-05-21 LAB — LIPID PANEL
Cholesterol: 161 mg/dL (ref 0–200)
Total CHOL/HDL Ratio: 3
Triglycerides: 83 mg/dL (ref 0.0–149.0)

## 2011-05-21 LAB — HEPATIC FUNCTION PANEL
ALT: 12 U/L (ref 0–35)
AST: 21 U/L (ref 0–37)
Albumin: 3.6 g/dL (ref 3.5–5.2)
Alkaline Phosphatase: 88 U/L (ref 39–117)

## 2011-05-21 LAB — BASIC METABOLIC PANEL
CO2: 29 mEq/L (ref 19–32)
Calcium: 9 mg/dL (ref 8.4–10.5)
Creatinine, Ser: 0.9 mg/dL (ref 0.4–1.2)
GFR: 69.77 mL/min (ref >60.00)

## 2011-05-23 ENCOUNTER — Telehealth: Payer: Self-pay | Admitting: *Deleted

## 2011-05-23 NOTE — Telephone Encounter (Signed)
Message copied by Burnell Blanks on Thu May 23, 2011  3:35 PM ------      Message from: Cassell Clement      Created: Tue May 21, 2011  5:56 PM       Please report.  The labs are stable. Lipids better.  Continue same meds.  Continue careful diet.

## 2011-05-23 NOTE — Telephone Encounter (Signed)
Mailed copy of labs and left message to call if any questions  

## 2011-05-28 ENCOUNTER — Other Ambulatory Visit: Payer: Self-pay | Admitting: Cardiology

## 2011-05-28 ENCOUNTER — Telehealth: Payer: Self-pay | Admitting: Cardiology

## 2011-05-28 NOTE — Telephone Encounter (Signed)
Has had a crisis and under a lot stress and needs something for nerves.  Has no PCP and would like for you to Rx something.  Has not been sleeping for more than two hours at a time for quite some time (had amitriptyline about 5 years ago and worked well).  [facility] [address]  Please advise (explained  Dr. Patty Sermons out of office until Friday)

## 2011-05-28 NOTE — Telephone Encounter (Signed)
New message: Please call patient regarding her lab results and some medication.

## 2011-05-29 MED ORDER — AMITRIPTYLINE HCL 25 MG PO TABS: 25.0000 mg | ORAL_TABLET | Freq: Every evening | ORAL | Status: DC | PRN

## 2011-05-29 NOTE — Telephone Encounter (Signed)
Advised patient and sent

## 2011-05-29 NOTE — Telephone Encounter (Signed)
Start amitriptyline 25 mg by mouth at at bedtime #30 refill x5

## 2011-07-03 ENCOUNTER — Encounter (HOSPITAL_BASED_OUTPATIENT_CLINIC_OR_DEPARTMENT_OTHER): Payer: Self-pay | Admitting: *Deleted

## 2011-07-03 ENCOUNTER — Emergency Department (HOSPITAL_BASED_OUTPATIENT_CLINIC_OR_DEPARTMENT_OTHER)
Admission: EM | Admit: 2011-07-03 | Discharge: 2011-07-03 | Disposition: A | Discharge status: Home / Self Care | Payer: Medicare Other | Attending: Emergency Medicine | Admitting: Emergency Medicine

## 2011-07-03 ENCOUNTER — Emergency Department (INDEPENDENT_AMBULATORY_CARE_PROVIDER_SITE_OTHER): Payer: Medicare Other

## 2011-07-03 DIAGNOSIS — M79609 Pain in unspecified limb: Secondary | ICD-10-CM | POA: Insufficient documentation

## 2011-07-03 DIAGNOSIS — W010XXA Fall on same level from slipping, tripping and stumbling without subsequent striking against object, initial encounter: Secondary | ICD-10-CM | POA: Insufficient documentation

## 2011-07-03 DIAGNOSIS — W19XXXA Unspecified fall, initial encounter: Secondary | ICD-10-CM

## 2011-07-03 DIAGNOSIS — R069 Unspecified abnormalities of breathing: Secondary | ICD-10-CM | POA: Insufficient documentation

## 2011-07-03 DIAGNOSIS — Y93K1 Activity, walking an animal: Secondary | ICD-10-CM | POA: Insufficient documentation

## 2011-07-03 DIAGNOSIS — R079 Chest pain, unspecified: Secondary | ICD-10-CM

## 2011-07-03 DIAGNOSIS — M542 Cervicalgia: Secondary | ICD-10-CM | POA: Insufficient documentation

## 2011-07-03 DIAGNOSIS — I10 Essential (primary) hypertension: Secondary | ICD-10-CM | POA: Insufficient documentation

## 2011-07-03 DIAGNOSIS — E7800 Pure hypercholesterolemia, unspecified: Secondary | ICD-10-CM | POA: Insufficient documentation

## 2011-07-03 DIAGNOSIS — E119 Type 2 diabetes mellitus without complications: Secondary | ICD-10-CM | POA: Insufficient documentation

## 2011-07-03 NOTE — ED Provider Notes (Addendum)
Tripped and fell 5 days ago while walking the dog injuring her left ribs and left hand. Denies shortness of breath presently feels she is improving on exam no distress lungs clear auscultation heart regular rate and rhythm chest is tender at left ribs abdomen nontender left upper extremity ecchymotic at dorsum of hand no point tenderness. Discuss with patient x-ray of left hand she declines fixating her hand is feeling much improved. Clinical suspicion for fracture pain is low. Plan Tylenol for pain. Incentive spirometerr offered the patient which she declines  Doug Sou, MD 07/03/11 1601  Doug Sou, MD 07/03/11 [phone]

## 2011-07-03 NOTE — ED Provider Notes (Signed)
History     CSN: [id number]  Arrival date & time 07/03/11  1305   First MD Initiated Contact with Patient 07/03/11 1509    Patient reports 5 days ago she was walking dog. States her [place] shepherd he incurred in an opposite direction causing her to fall onto her left chest. Reports she tried to catch herself with her left hand. Reports initially had swelling of the left hand but this is improved and is no longer hurting her. States she continues to have left chest pain. States pain is worse with deep breathing, coughing, and palpation.  Patient is a 70 y.o. female presenting with fall. The history is provided by the patient.  Fall The accident occurred more than 2 days ago. The fall occurred while walking. There was no blood loss. Point of impact: Left chest and left hand. Pain location: Left chest and left hand. The pain is moderate. She was ambulatory at the scene. There was no entrapment after the fall. There was no drug use involved in the accident. There was no alcohol use involved in the accident. Pertinent negatives include no fever, no numbness, no abdominal pain, no nausea, no vomiting, no hematuria, no headaches, no hearing loss, no loss of consciousness and no tingling. Exacerbated by: Deep breathing, coughing and palpation. She has tried immobilization for the symptoms. The treatment provided mild relief.    Past Medical History  Diagnosis Date  . Personal history of sarcoidosis   . Hypertension   . Diabetes mellitus   . Hypercholesterolemia   . Hiatal hernia   . Diverticulosis     Past Surgical History  Procedure Date  . Abdominal hysterectomy   . Cholecystectomy   . Appendectomy   . Septal deviation   . Turbinate hypertrophy     Family History  Problem Relation Age of Onset  . Hypertension Mother   . Heart disease Father   . Hyperlipidemia Father     History  Substance Use Topics  . Smoking status: Never Smoker   . Smokeless tobacco: Not on file  . Alcohol  Use: No    OB History    Grav Para Term Preterm Abortions TAB SAB Ect Mult Living                  Review of Systems  Constitutional: Negative for fever and chills.  HENT: Positive for neck pain.   Respiratory: Negative for cough and shortness of breath.   Cardiovascular: Positive for chest pain.  Gastrointestinal: Negative for nausea, vomiting and abdominal pain.  Genitourinary: Negative for hematuria.  Neurological: Negative for tingling, loss of consciousness, syncope, weakness, light-headedness, numbness and headaches.  All other systems reviewed and are negative.    Allergies  Codeine; Lipitor; Naprosyn; and Polysorbate 80-sertraline  Home Medications   Current Outpatient Rx  Name Route Sig Dispense Refill  . RISAQUAD PO CAPS Oral Take 1 capsule by mouth daily.      Marland Kitchen AMITRIPTYLINE HCL 25 MG PO TABS Oral Take 1 tablet (25 mg total) by mouth at bedtime as needed. 30 tablet 5  . ASPIRIN 325 MG PO TABS Oral Take 325 mg by mouth daily.     . B COMPLEX VITAMINS PO CAPS Oral Take 1 capsule by mouth daily.      Marland Kitchen LISINOPRIL 10 MG PO TABS  TAKE ONE TABLET BY MOUTH EVERY DAY 90 tablet 11  . MELOXICAM 15 MG PO TABS Oral Take 15 mg by mouth daily.     Marland Kitchen  METFORMIN HCL 1000 MG PO TABS  TAKE ONE TABLET BY MOUTH EVERY DAY 90 tablet 1  . METFORMIN HCL ER (MOD) 1000 MG PO TB24 Oral Take 1,000 mg by mouth daily.        BP 147/100  Pulse 92  Temp 98.7 F (37.1 C)  Resp 16  Ht 5\' 3"  (1.6 m)  Wt 190 lb (86.183 kg)  BMI 33.66 kg/m2  SpO2 100%  Physical Exam  Vitals reviewed. Constitutional: She is oriented to person, place, and time. Vital signs are normal. She appears well-developed and well-nourished.  HENT:  Head: Normocephalic and atraumatic.  Eyes: Conjunctivae are normal. Pupils are equal, round, and reactive to light.  Neck: Normal range of motion. Neck supple.  Cardiovascular: Normal rate, regular rhythm and normal heart sounds.  Exam reveals no friction rub.   No  murmur heard. Pulmonary/Chest: Effort normal and breath sounds normal. She has no wheezes. She has no rhonchi. She has no rales. She exhibits tenderness.    Abdominal: Soft. Bowel sounds are normal. She exhibits no distension and no mass. There is no tenderness. There is no rebound and no guarding.  Musculoskeletal: Normal range of motion.  Neurological: She is alert and oriented to person, place, and time. Coordination normal.  Skin: Skin is warm and dry. No rash noted. No erythema. No pallor.    ED Course  Procedures   Labs Reviewed - No data to display Dg Ribs Unilateral W/chest Left  07/03/2011  *RADIOLOGY REPORT*  Clinical Data: Left lower chest pain after fall  LEFT RIBS AND CHEST - 3+ VIEW  Comparison: April 06, 2010  Findings: The cardiac silhouette, mediastinum, pulmonary vasculature are within normal limits.  Both lungs are clear.  No pneumothorax.  No rib fractures.  IMPRESSION: Stable, normal chest x-ray.  There is no evidence of rib fracture or pneumothorax.  Original Report Authenticated By: Brandon Melnick, M.D.     No diagnosis found.    MDM   Discussed patient with Dr. Rennis Chris. Agrees with plan. Patient does not want to have been sent from term. Advised followup with primary care physician for worsening symptoms and Tylenol for pain. Patient is ready for discharge  Thomasene Lot, [place] 07/03/11 1612

## 2011-07-03 NOTE — ED Notes (Signed)
Pt c/o fall sidewalk  X 5 days ago injuring left ribs

## 2011-07-04 NOTE — ED Provider Notes (Signed)
Medical screening examination/treatment/procedure(s) were conducted as a shared visit with non-physician practitioner(s) and myself.  I personally evaluated the patient during the encounter  Doug Sou, MD 07/04/11 0025

## 2011-07-15 DIAGNOSIS — Z23 Encounter for immunization: Secondary | ICD-10-CM | POA: Diagnosis not present

## 2011-07-29 ENCOUNTER — Telehealth: Payer: Self-pay | Admitting: Cardiology

## 2011-07-29 NOTE — Telephone Encounter (Signed)
New Problem:    Patient called wanting to be seen by Dr. Patty Sermons because she has been having Gastrointestinal problems and really would like to be worked in to see him within the next two days.  Please call back.

## 2011-07-29 NOTE — Telephone Encounter (Signed)
Did schedule appointment with Lawson Fiscal NP for tomorrow.  Recent fall, tightness in chest, and states a lot of gas.  Did get evaluated after fall with chest xray late December.  Discomfort does go around to back and feels tight at times.  Has been getting worse over last week.  Advised if worse or shortness of breath to go to emergency room.

## 2011-07-30 ENCOUNTER — Ambulatory Visit (INDEPENDENT_AMBULATORY_CARE_PROVIDER_SITE_OTHER)
Admission: RE | Admit: 2011-07-30 | Discharge: 2011-07-30 | Disposition: A | Discharge status: Home / Self Care | Payer: Medicare Other | Source: Ambulatory Visit | Attending: Nurse Practitioner | Admitting: Nurse Practitioner

## 2011-07-30 ENCOUNTER — Encounter: Payer: Self-pay | Admitting: Nurse Practitioner

## 2011-07-30 ENCOUNTER — Ambulatory Visit (INDEPENDENT_AMBULATORY_CARE_PROVIDER_SITE_OTHER): Payer: Medicare Other | Admitting: Nurse Practitioner

## 2011-07-30 DIAGNOSIS — R109 Unspecified abdominal pain: Secondary | ICD-10-CM | POA: Diagnosis not present

## 2011-07-30 LAB — BASIC METABOLIC PANEL
BUN: 14 mg/dL (ref 6–23)
CO2: 29 mEq/L (ref 19–32)
Calcium: 9.5 mg/dL (ref 8.4–10.5)
Chloride: 103 mEq/L (ref 96–112)
Creat: 0.88 mg/dL (ref 0.50–1.10)
Glucose, Bld: 123 mg/dL — ABNORMAL HIGH (ref 70–99)
Potassium: 3.7 mEq/L (ref 3.5–5.3)
Sodium: 141 mEq/L (ref 135–145)

## 2011-07-30 LAB — CBC WITH DIFFERENTIAL/PLATELET
Basophils Absolute: 0.1 10*3/uL (ref 0.0–0.1)
Basophils Relative: 1 % (ref 0–1)
Eosinophils Absolute: 0.1 10*3/uL (ref 0.0–0.7)
Eosinophils Relative: 2 % (ref 0–5)
HCT: 42.6 % (ref 36.0–46.0)
Hemoglobin: 13.8 g/dL (ref 12.0–15.0)
Lymphocytes Relative: 30 % (ref 12–46)
Lymphs Abs: 2.4 10*3/uL (ref 0.7–4.0)
MCH: 30.4 pg (ref 26.0–34.0)
MCHC: 32.4 g/dL (ref 30.0–36.0)
MCV: 93.8 fL (ref 78.0–100.0)
Monocytes Absolute: 0.6 10*3/uL (ref 0.1–1.0)
Monocytes Relative: 9 % (ref 3–12)
Neutro Abs: 4.5 10*3/uL (ref 1.7–7.7)
Neutrophils Relative %: 58 % (ref 43–77)
Platelets: 258 10*3/uL (ref 150–400)
RBC: 4.54 MIL/uL (ref 3.87–5.11)
RDW: 12.9 % (ref 11.5–15.5)
WBC: 7.7 10*3/uL (ref 4.0–10.5)

## 2011-07-30 MED ORDER — IOHEXOL 300 MG/ML  SOLN
100.0000 mL | Freq: Once | INTRAMUSCULAR | Status: AC | PRN
  Administered 2011-07-30: 100 mL via INTRAVENOUS

## 2011-07-30 NOTE — Progress Notes (Signed)
   Elaine Oconnell Date of Birth: Nov 29, 1940 Medical Record #[id number]  History of Present Illness: Elaine Oconnell is seen today for a work in visit. She is seen for Dr. Patty Sermons. She is a 71 year old female with no known CAD. Does have HTN and diabetes. She is obese. She is here due to an almost 1 1/2 week history of significant indigestion. She is not able to eat because she "blows up". She feels like there is an "inner tube" blowed up in her abdomen. She is nauseated. She denies fever but has had chills. Has lost 5 pounds. She did suffer a fall back at Christmas. She notes that she landed "hard" on her left breast and abdomen. It is still tender to touch. She did have negative rib films about a week after that fall. She is not having chest pain. No exertional symptoms. She is having diarrhea but no bloody stools. This may have started after eating some popcorn. She has a history of diverticulosis.   Current Outpatient Prescriptions on File Prior to Visit  Medication Sig Dispense Refill  . aspirin 325 MG tablet Take 325 mg by mouth 2 (two) times daily.       . Cyanocobalamin (VITAMIN B-12 PO) Take by mouth.      Marland Kitchen lisinopril (PRINIVIL,ZESTRIL) 10 MG tablet TAKE ONE TABLET BY MOUTH EVERY DAY  90 tablet  11  . metFORMIN (GLUCOPHAGE) 1000 MG tablet TAKE ONE TABLET BY MOUTH EVERY DAY  90 tablet  1  . DISCONTD: metFORMIN (GLUMETZA) 1000 MG (MOD) 24 hr tablet Take 1,000 mg by mouth daily.          Allergies  Allergen Reactions  . Codeine     REACTION: nausea  . Lipitor (Atorvastatin Calcium)   . Naprosyn (Naproxen)   . Polysorbate 80-Sertraline     Past Medical History  Diagnosis Date  . Personal history of sarcoidosis   . Hypertension   . Diabetes mellitus   . Hypercholesterolemia   . Hiatal hernia   . Diverticulosis   . Obesity     Past Surgical History  Procedure Date  . Abdominal hysterectomy   . Cholecystectomy   . Appendectomy   . Septal deviation   . Turbinate hypertrophy      History  Smoking status  . Never Smoker   Smokeless tobacco  . Not on file    History  Alcohol Use No    Family History  Problem Relation Age of Onset  . Hypertension Mother   . Heart disease Father   . Hyperlipidemia Father     Review of Systems: The review of systems is per the HPI. No chest pain. Not short of breath.  All other systems were reviewed and are negative.  Physical Exam: BP 110/80  Pulse 70  Ht 5\' 3"  (1.6 m)  Wt 194 lb (87.998 kg)  BMI 34.37 kg/m2 Patient is in no acute distress but has to keep lying down during the exam. Skin is warm and dry. Color is normal.  HEENT is unremarkable. Normocephalic/atraumatic. PERRL. Sclera are nonicteric. Neck is supple. No masses. No JVD. Lungs are clear. Cardiac exam shows a regular rate and rhythm. Abdomen is obese but soft. She does have left upper tenderness upon palpation. Bowel sounds are somewhat diminished. Extremities are without edema. Gait and ROM are intact. No gross neurologic deficits noted.   LABORATORY DATA: EKG shows sinus with PVC's.    Assessment / Plan:

## 2011-07-30 NOTE — Assessment & Plan Note (Signed)
She is having a multitude of abdominal complaints which include nausea, diarrhea, abdominal pain and tenderness with chills. No fever. I have talked with Dr. Patty Sermons. We will check a CBC and BMET stat. We will arrange for a CT of her abdomen later today. Further disposition to follow. This does not sound cardiac related. Her EKG is ok.

## 2011-07-30 NOTE — Patient Instructions (Addendum)
We are going to arrange for a CT scan of your belly. We can do this later today.   We are going to check some labs today.

## 2011-07-31 ENCOUNTER — Telehealth: Payer: Self-pay | Admitting: Internal Medicine

## 2011-07-31 ENCOUNTER — Telehealth: Payer: Self-pay | Admitting: *Deleted

## 2011-07-31 NOTE — Telephone Encounter (Signed)
Message copied by Burnell Blanks on Wed Jul 31, 2011  2:33 PM ------      Message from: Rosalio Macadamia      Created: Tue Jul 30, 2011 11:35 AM       Ok to report. Labs are satisfactory. OK for CT scan.

## 2011-07-31 NOTE — Telephone Encounter (Signed)
Advised of CT results and labs

## 2011-07-31 NOTE — Telephone Encounter (Signed)
Message copied by Burnell Blanks on Wed Jul 31, 2011  2:32 PM ------      Message from: Rosalio Macadamia      Created: Tue Jul 30, 2011  4:22 PM       Ok to report. Ct looks ok. Labs are ok. Would recommend follow up with GI.

## 2011-07-31 NOTE — Telephone Encounter (Signed)
Spoke with patient and she saw the PA for cardiology yesterday because she was having pain and she was not sure if it was her heart or indigestion. Her EKG, CBC and abdominal CT was normal. She reports burping, indigestion and bloating. Normal bowel movements. She has tried [employer] a couple of times and Tums. She will try Prilosec OTC daily. Reviewed anti reflux instructions with patient. Scheduled OV on 08/14/11 9:45/10:00 AM with Dr. Juanda Chance.

## 2011-08-01 NOTE — Telephone Encounter (Signed)
I agree with giving her Prilosec 20 mg po qd till she sees me.

## 2011-08-05 ENCOUNTER — Encounter: Payer: Self-pay | Admitting: *Deleted

## 2011-08-06 ENCOUNTER — Encounter: Payer: Self-pay | Admitting: Internal Medicine

## 2011-08-06 ENCOUNTER — Other Ambulatory Visit (INDEPENDENT_AMBULATORY_CARE_PROVIDER_SITE_OTHER): Payer: Medicare Other

## 2011-08-06 ENCOUNTER — Ambulatory Visit (INDEPENDENT_AMBULATORY_CARE_PROVIDER_SITE_OTHER): Payer: Medicare Other | Admitting: Internal Medicine

## 2011-08-06 VITALS — BP 110/82 | HR 78 | Ht 63.0 in | Wt 196.0 lb

## 2011-08-06 DIAGNOSIS — R195 Other fecal abnormalities: Secondary | ICD-10-CM

## 2011-08-06 DIAGNOSIS — R1013 Epigastric pain: Secondary | ICD-10-CM | POA: Diagnosis not present

## 2011-08-06 DIAGNOSIS — K3189 Other diseases of stomach and duodenum: Secondary | ICD-10-CM | POA: Diagnosis not present

## 2011-08-06 LAB — AMYLASE: Amylase: 36 U/L (ref 27–131)

## 2011-08-06 NOTE — Progress Notes (Signed)
Elaine Oconnell 05-Jun-1941 MRN [id number]   History of Present Illness:  This is a 71 year old white female with dyspepsia which started several weeks ago after she ate popcorn and chocolate. She had belching and nausea. Her symptoms improved after starting Prilosec 20 mg daily. She is now 90% improved. She was taking Naprosyn 220 mg a day and most recently meloxicam 15 mg a day. She was also on a seven-day course of Keflex. An upper endoscopy in July 2011 showed a 1-2 cm hiatal hernia. A prior endoscopy in 2001 showed gastritis. A recent CT scan of the abdomen was negative. A colonoscopy in July 2011 for diarrhea showed moderately severe diverticulosis and a diminutive polyp. She has a history of prior cholecystectomy.  Past Medical History  Diagnosis Date  . Personal history of sarcoidosis   . Hypertension   . Diabetes mellitus   . Hypercholesterolemia   . Hiatal hernia   . Diverticulosis   . Obesity   . GERD (gastroesophageal reflux disease)    Past Surgical History  Procedure Date  . Abdominal hysterectomy   . Cholecystectomy   . Appendectomy   . Septal deviation   . Turbinate hypertrophy   . Knee surgery 02/2011    left    reports that she has never smoked. She has never used smokeless tobacco. She reports that she does not drink alcohol or use illicit drugs. family history includes Diabetes in her maternal grandfather; Heart disease in her father, maternal grandmother, paternal grandfather, and paternal grandmother; Hyperlipidemia in her father; and Hypertension in her mother. Allergies  Allergen Reactions  . Codeine     REACTION: nausea  . Lipitor (Atorvastatin Calcium)   . Naprosyn (Naproxen)   . Polysorbate 80-Sertraline         Review of Systems: Denies dysphagia, odynophagia, chest pain or shortness of breath  The remainder of the 10 point ROS is negative except as outlined in H&P   Physical Exam: General appearance  Well developed, in no distress. Eyes- non  icteric. HEENT nontraumatic, normocephalic. Mouth no lesions, tongue papillated, no cheilosis. Neck supple without adenopathy, thyroid not enlarged, no carotid bruits, no JVD. Lungs Clear to auscultation bilaterally. Cor normal S1, normal S2, regular rhythm, no murmur,  quiet precordium. Abdomen: Soft obese abdomen, tender in epigastrium. Normoactive bowel sounds. No distention.  Rectal: Strongly Hemoccult-positive stool Extremities no pedal edema. Skin no lesions. Neurological alert and oriented x 3. Psychological normal mood and affect.  Assessment and Plan:   Problem #1 Episode of dyspepsia following a course of Mobic and Naprosyn. We need to rule out gastropathy or peptic ulcer disease. Hemoccult-positive stool may be caused by NSAID  gastropathy. Her recent hemoglobin was normal and she is 90% improved on Prilosec 20 mg daily. We will increase Prilosec to 40 mg  for a week. If her symptoms do not resolve completely in the next several weeks, we will proceed with an upper endoscopy. We will check a sprue profile, IgA level, amylase and lipase.  Problem #2 History of colon polyps. Her last colonoscopy was in July 2011. A recall colonoscopy will be due in July 2021.   08/06/2011 Lina Sar

## 2011-08-06 NOTE — Patient Instructions (Signed)
Your physician has requested that you go to the basement for the following lab work before leaving today: Sprue Profile, IgA, Amylase, Lipase CC: Dr Patty Sermons

## 2011-08-07 LAB — CELIAC PANEL 10
Gliadin IgG: 5.6 U/mL (ref <20)
Tissue Transglut Ab: 10.1 U/mL (ref <20)
Tissue Transglutaminase Ab, IgA: 3.3 U/mL (ref <20)

## 2011-08-08 ENCOUNTER — Telehealth: Payer: Self-pay | Admitting: *Deleted

## 2011-08-08 NOTE — Telephone Encounter (Signed)
Spoke with patient and gave her results as per Dr. Juanda Chance. Patient states she saw Dr. Neil Crouch show the other day and she is wondering if the enzymes in her body could be "messed up because of all the medications I have take for the knee surgery." Patient states she started with problems after her surgery belching, gas and diarrhea. Please, advise.

## 2011-08-08 NOTE — Telephone Encounter (Signed)
Message copied by Daphine Deutscher on Thu Aug 08, 2011 11:41 AM ------      Message from: Hart Carwin      Created: Wed Aug 07, 2011  6:40 PM       Please call pt with negative celiac disease panel. No evidence for gluten allergy

## 2011-08-08 NOTE — Telephone Encounter (Signed)
Please see my OV note, in which I told the pt exactly the same thing, NSAIDs and Mobic causing gastropathy. She was told not to take it. She was well so I did not schedule EGD.Contibue Prilosec 40 mg po qd

## 2011-08-09 ENCOUNTER — Telehealth: Payer: Self-pay | Admitting: *Deleted

## 2011-08-09 NOTE — Telephone Encounter (Signed)
Spoke with patient and gave her Dr. Regino Schultze recommendations again.

## 2011-08-09 NOTE — Telephone Encounter (Signed)
error 

## 2011-08-14 ENCOUNTER — Ambulatory Visit: Payer: Medicare Other | Admitting: Internal Medicine

## 2011-09-18 ENCOUNTER — Other Ambulatory Visit: Payer: Medicare Other

## 2011-09-18 ENCOUNTER — Ambulatory Visit: Payer: Medicare Other | Admitting: Cardiology

## 2011-10-31 ENCOUNTER — Other Ambulatory Visit (INDEPENDENT_AMBULATORY_CARE_PROVIDER_SITE_OTHER): Payer: Medicare Other

## 2011-10-31 ENCOUNTER — Ambulatory Visit (INDEPENDENT_AMBULATORY_CARE_PROVIDER_SITE_OTHER): Payer: Medicare Other | Admitting: Cardiology

## 2011-10-31 ENCOUNTER — Encounter: Payer: Self-pay | Admitting: Cardiology

## 2011-10-31 VITALS — BP 110/80 | HR 78 | Ht 63.0 in | Wt 192.0 lb

## 2011-10-31 DIAGNOSIS — E119 Type 2 diabetes mellitus without complications: Secondary | ICD-10-CM

## 2011-10-31 DIAGNOSIS — E7800 Pure hypercholesterolemia, unspecified: Secondary | ICD-10-CM

## 2011-10-31 DIAGNOSIS — I119 Hypertensive heart disease without heart failure: Secondary | ICD-10-CM | POA: Diagnosis not present

## 2011-10-31 DIAGNOSIS — I10 Essential (primary) hypertension: Secondary | ICD-10-CM

## 2011-10-31 DIAGNOSIS — Z862 Personal history of diseases of the blood and blood-forming organs and certain disorders involving the immune mechanism: Secondary | ICD-10-CM

## 2011-10-31 LAB — LIPID PANEL
Cholesterol: 172 mg/dL (ref 0–200)
HDL: 51.5 mg/dL (ref >39.00)
LDL Cholesterol: 105 mg/dL — ABNORMAL HIGH (ref 0–99)
Triglycerides: 76 mg/dL (ref 0.0–149.0)
VLDL: 15.2 mg/dL (ref 0.0–40.0)

## 2011-10-31 LAB — HEPATIC FUNCTION PANEL
Albumin: 3.6 g/dL (ref 3.5–5.2)
Total Bilirubin: 0.5 mg/dL (ref 0.3–1.2)

## 2011-10-31 LAB — BASIC METABOLIC PANEL
BUN: 22 mg/dL (ref 6–23)
Calcium: 9.2 mg/dL (ref 8.4–10.5)
GFR: 65.24 mL/min (ref >60.00)
Glucose, Bld: 100 mg/dL — ABNORMAL HIGH (ref 70–99)

## 2011-10-31 NOTE — Assessment & Plan Note (Signed)
Pressure has been stable on current therapy.  She's not having any headaches or dizzy spells.  No chest pain or symptoms of CHF

## 2011-10-31 NOTE — Progress Notes (Signed)
Elaine Oconnell Date of Birth:  Jan 13, 1941 [facility] [address] [place], [place]  [address] [phone]         Fax   [phone]  History of Present Illness: This pleasant 71 year old woman is seen for a scheduled followup office visit.  She has a past history of essential history of hypercholesterolemia.  She is a mild diabetic.  She has a history of an active sarcoidosis.  She has a history of osteoarthritis.  She had a past history of palpitations.  Recently she's been having more problems with sinus and allergies.  She's also had dyspepsia and saw Dr. Juanda Chance who started her on Prilosec  Current Outpatient Prescriptions  Medication Sig Dispense Refill  . aspirin 325 MG tablet Take 325 mg by mouth as needed.       . Cyanocobalamin (VITAMIN B-12 PO) Take 1 tablet by mouth daily.       Marland Kitchen lisinopril (PRINIVIL,ZESTRIL) 10 MG tablet TAKE ONE TABLET BY MOUTH EVERY DAY  90 tablet  11  . metFORMIN (GLUCOPHAGE) 1000 MG tablet         Allergies  Allergen Reactions  . Codeine     REACTION: nausea  . Lipitor (Atorvastatin Calcium)   . Naprosyn (Naproxen)   . Polysorbate 80-Sertraline     Patient Active Problem List  Diagnoses  . HYPERTENSION  . GASTRITIS, ANTRAL  . DIVERTICULOSIS OF COLON  . DIARRHEA  . Personal history of sarcoidosis  . Hypertension  . Diabetes mellitus  . Hypercholesterolemia  . Acute bronchitis  . PVC (premature ventricular contraction)  . Abdominal  pain, other specified site    History  Smoking status  . Never Smoker   Smokeless tobacco  . Never Used    History  Alcohol Use No    Family History  Problem Relation Age of Onset  . Hypertension Mother   . Heart disease Father   . Hyperlipidemia Father   . Heart disease Maternal Grandmother   . Heart disease Paternal Grandmother   . Heart disease Paternal Grandfather   . Diabetes Maternal Grandfather     Review of Systems: Constitutional: no fever chills  diaphoresis or fatigue or change in weight.  Head and neck: no hearing loss, no epistaxis, no photophobia or visual disturbance. Respiratory: No cough, shortness of breath or wheezing. Cardiovascular: No chest pain peripheral edema, palpitations. Gastrointestinal: No abdominal distention, no abdominal pain, no change in bowel habits hematochezia or melena. Genitourinary: No dysuria, no frequency, no urgency, no nocturia. Musculoskeletal:No arthralgias, no back pain, no gait disturbance or myalgias. Neurological: No dizziness, no headaches, no numbness, no seizures, no syncope, no weakness, no tremors. Hematologic: No lymphadenopathy, no easy bruising. Psychiatric: No confusion, no hallucinations, no sleep disturbance.    Physical Exam: Filed Vitals:   10/31/11 1008  BP: 110/80  Pulse: 78   the general appearance reveals a well-developed well-nourished woman in no distress.The chest is clear to percussion and auscultation. There are no rales or rhonchi. Expansion of the chest is symmetrical.  The precordium is quiet.  The first heart sound is normal.  The second heart sound is physiologically split.  There is no murmur gallop rub or click.  There is no abnormal lift or heave.  The abdomen is soft and nontender. Bowel sounds are normal. The liver and spleen are not enlarged. There Are no abdominal masses. There are no bruits.  The pedal pulses are good.  There is no phlebitis or edema.  There  is no cyanosis or clubbing. Strength is normal and symmetrical in all extremities.  There is no lateralizing weakness.  There are no sensory deficits.  The skin is warm and dry.  There is no rash.    Assessment / Plan: The patient has lost 7 pounds since last visit intentionally.  She is to continue careful diet.  Continue same medication and be rechecked in 4 months for fasting lipid panel hepatic function panel nasal metabolic panel and hemoglobin Z6X

## 2011-10-31 NOTE — Assessment & Plan Note (Signed)
The patient is not having any pulmonary symptoms to suggest recurrence of her sarcoidosis

## 2011-10-31 NOTE — Progress Notes (Signed)
Quick Note:  Please report to patient. The recent labs are stable. Continue same medication and careful diet. BS 100 better ______

## 2011-10-31 NOTE — Assessment & Plan Note (Signed)
The patient has had no hypoglycemic episodes.  She is on metformin which she is tolerating okay dose of 1000 milligrams daily

## 2011-10-31 NOTE — Patient Instructions (Signed)
Your physician recommends that you continue on your current medications as directed. Please refer to the Current Medication list given to you today.  Your physician recommends that you schedule a follow-up appointment in: 4 months with fasting labs (lp/bmet/hfp/a1c)  

## 2011-11-01 ENCOUNTER — Telehealth: Payer: Self-pay | Admitting: *Deleted

## 2011-11-01 NOTE — Telephone Encounter (Signed)
Mailed copy of labs and left message to call if any questions  

## 2011-11-01 NOTE — Telephone Encounter (Signed)
Message copied by Burnell Blanks on Fri Nov 01, 2011  2:58 PM ------      Message from: Cassell Clement      Created: Thu Oct 31, 2011  9:08 PM       Please report to patient.  The recent labs are stable. Continue same medication and careful diet. BS 100 better

## 2011-11-21 ENCOUNTER — Other Ambulatory Visit: Payer: Self-pay | Admitting: Cardiology

## 2011-11-21 NOTE — Telephone Encounter (Signed)
Refilled metformin

## 2011-12-02 ENCOUNTER — Ambulatory Visit (INDEPENDENT_AMBULATORY_CARE_PROVIDER_SITE_OTHER): Payer: Medicare Other | Admitting: Family Medicine

## 2011-12-02 ENCOUNTER — Encounter: Payer: Self-pay | Admitting: Family Medicine

## 2011-12-02 VITALS — BP 117/76 | HR 93 | Temp 98.6°F | Resp 16 | Ht 62.25 in | Wt 193.6 lb

## 2011-12-02 DIAGNOSIS — R109 Unspecified abdominal pain: Secondary | ICD-10-CM | POA: Diagnosis not present

## 2011-12-02 DIAGNOSIS — R102 Pelvic and perineal pain: Secondary | ICD-10-CM

## 2011-12-02 LAB — POCT URINALYSIS DIPSTICK
Ketones, UA: NEGATIVE
Nitrite, UA: NEGATIVE
Protein, UA: NEGATIVE
Urobilinogen, UA: 0.2
pH, UA: 5.5

## 2011-12-02 LAB — POCT CBC
Granulocyte percent: 60.7 %G (ref 37–80)
Hemoglobin: 12.6 g/dL (ref 12.2–16.2)
MCV: 90.2 fL (ref 80–97)
MID (cbc): 0.6 (ref 0–0.9)
Platelet Count, POC: 228 10*3/uL (ref 142–424)
RBC: 4.27 M/uL (ref 4.04–5.48)

## 2011-12-02 LAB — POCT UA - MICROSCOPIC ONLY
Casts, Ur, LPF, POC: NEGATIVE
Crystals, Ur, HPF, POC: NEGATIVE
Yeast, UA: NEGATIVE

## 2011-12-02 MED ORDER — DOXYCYCLINE HYCLATE 100 MG PO TABS: 100.0000 mg | ORAL_TABLET | Freq: Two times a day (BID) | ORAL | Status: DC

## 2011-12-02 NOTE — Progress Notes (Signed)
Subjective:    Patient ID: Elaine Oconnell, female    DOB: 1941/06/15, 71 y.o.   MRN: [id number]  HPI 71 yo female here for abdominal pain, fever, and headache.  Woke up yesterday morning about 2 am.  Bilateral lower abdominal pain.  Left church because felt badly.  Chills, subjective fever, bodyaches.  Some nausea but mild.  Got a tick off of her upper thigh/lower buttock this morning when itching.  Has been outside in shorts everyday last 3-4 days so could have gotten on any of those times.   Denies dysuria.  Does have urinary frequency but this is common.  ABdominal pain better today.  Still has dull headache.  No more fever or chills.  Tried an aspirin for the headache yesterday.  DId help.  Some mild diarrhea.   Review of Systems Negative except as per HPI     Objective:   Physical Exam  Constitutional: Vital signs are normal. She appears well-developed and well-nourished. She is active.  Cardiovascular: Normal rate, regular rhythm, normal heart sounds and normal pulses.   Pulmonary/Chest: Effort normal and breath sounds normal.  Abdominal: Soft. Normal appearance and bowel sounds are normal. She exhibits no distension and no mass. There is no hepatosplenomegaly. There is tenderness in the right lower quadrant, suprapubic area and left lower quadrant. There is no rigidity, no rebound, no guarding, no CVA tenderness, no tenderness at McBurney's point and negative Murphy's sign. No hernia.  Neurological: She is alert.    Results for orders placed in visit on 12/02/11  POCT UA - MICROSCOPIC ONLY      Component Value Range   WBC, Ur, HPF, POC 2-4     RBC, urine, microscopic 2-4     Bacteria, U Microscopic small     Mucus, UA small     Epithelial cells, urine per micros 3-5     Crystals, Ur, HPF, POC neg     Casts, Ur, LPF, POC neg     Yeast, UA neg    POCT URINALYSIS DIPSTICK      Component Value Range   Color, UA yellow     Clarity, UA hazy     Glucose, UA neg     Bilirubin,  UA neg     Ketones, UA neg     Spec Grav, UA 1.020     Blood, UA trace     pH, UA 5.5     Protein, UA neg     Urobilinogen, UA 0.2     Nitrite, UA neg     Leukocytes, UA Trace    POCT CBC      Component Value Range   WBC 6.5  4.6 - 10.2 (K/uL)   Lymph, poc 2.0  0.6 - 3.4    POC LYMPH PERCENT 30.2  10 - 50 (%L)   MID (cbc) 0.6  0 - 0.9    POC MID % 9.1  0 - 12 (%M)   POC Granulocyte 3.9  2 - 6.9    Granulocyte percent 60.7  37 - 80 (%G)   RBC 4.27  4.04 - 5.48 (M/uL)   Hemoglobin 12.6  12.2 - 16.2 (g/dL)   HCT, POC 47.8  29.5 - 47.9 (%)   MCV 90.2  80 - 97 (fL)   MCH, POC 29.5  27 - 31.2 (pg)   MCHC 32.7  31.8 - 35.4 (g/dL)   RDW, POC 62.1     Platelet Count, POC 228  142 - 424 (  K/uL)   MPV 10.9  0 - 99.8 (fL)         Assessment & Plan:  Nonspecific symptoms - most resolving.  Questionable prolonged tick exposure (tick she brought in today that she got off of her is still alive and crawling.  Not engorged).  Urine culture pending.  Patient very worried about tick illness.  Symptoms can be c/w RMSF (except no rash), but symptoms very nonspecific.   Will treat with doxy while awaiting culture.  Patient reports no issues with doxy previously.

## 2011-12-04 LAB — URINE CULTURE

## 2011-12-07 ENCOUNTER — Telehealth: Payer: Self-pay

## 2011-12-07 NOTE — Telephone Encounter (Signed)
PT STATES HER STOMACH HURTS - MIGHT BE KIDNEY INFECTION OR EATING CHOCOLATE FOR TWO DAYS IN A ROW.

## 2011-12-08 NOTE — Telephone Encounter (Signed)
Left message for patient to return to clinic if worsening.

## 2011-12-08 NOTE — Telephone Encounter (Signed)
Patient feels she needs to be on a medication for her kidney infection. She had a horrible night last night with abd. Pain. She is on doxy for a tick bite.

## 2011-12-08 NOTE — Telephone Encounter (Signed)
No mention of kidney infection in the notes. If worsening needs recheck

## 2011-12-10 ENCOUNTER — Ambulatory Visit (INDEPENDENT_AMBULATORY_CARE_PROVIDER_SITE_OTHER): Payer: Medicare Other | Admitting: Family Medicine

## 2011-12-10 VITALS — BP 116/79 | HR 106 | Temp 97.7°F | Resp 16 | Ht 62.0 in | Wt 193.0 lb

## 2011-12-10 DIAGNOSIS — N390 Urinary tract infection, site not specified: Secondary | ICD-10-CM | POA: Diagnosis not present

## 2011-12-10 LAB — POCT URINALYSIS DIPSTICK
Protein, UA: 100
Spec Grav, UA: 1.025
Urobilinogen, UA: 0.2

## 2011-12-10 LAB — POCT UA - MICROSCOPIC ONLY
Crystals, Ur, HPF, POC: NEGATIVE
Yeast, UA: NEGATIVE

## 2011-12-10 MED ORDER — NITROFURANTOIN MONOHYD MACRO 100 MG PO CAPS: 100.0000 mg | ORAL_CAPSULE | Freq: Two times a day (BID) | ORAL | Status: AC

## 2011-12-10 NOTE — Progress Notes (Addendum)
Patient Name: Elaine Oconnell Date of Birth: [birth year] Medical Record Number: [id number] Gender: female Date of Encounter: 12/10/2011  History of Present Illness:  Elaine Oconnell is a 71 y.o. very pleasant female patient who presents with the following:  Here on 12/02/11, was treat with doxy due to concern about tick illness, had non- specific UA/ micro.  Urine culture showed multiple colonies of mixed bacteria.  She is here today due to back pain, dysuria, frequency, back pain and lower abdominal pain. These symptoms have gotten worse over the last few days.  She thinks that she had a fever a few days ago.   She has noted nausea, has nearly vomited but not really.   She has not eaten much due to being "afraid to eat."  She has been drinking some fluids.   Patient Active Problem List  Diagnoses  . HYPERTENSION  . GASTRITIS, ANTRAL  . DIVERTICULOSIS OF COLON  . DIARRHEA  . Personal history of sarcoidosis  . Hypertension  . Diabetes mellitus  . Hypercholesterolemia  . Acute bronchitis  . PVC (premature ventricular contraction)  . Abdominal  pain, other specified site   Past Medical History  Diagnosis Date  . Personal history of sarcoidosis   . Hypertension   . Diabetes mellitus   . Hypercholesterolemia   . Hiatal hernia   . Diverticulosis   . Obesity   . GERD (gastroesophageal reflux disease)    Past Surgical History  Procedure Date  . Abdominal hysterectomy   . Cholecystectomy   . Appendectomy   . Septal deviation   . Turbinate hypertrophy   . Knee surgery 02/2011    left   History  Substance Use Topics  . Smoking status: Never Smoker   . Smokeless tobacco: Never Used  . Alcohol Use: No   Family History  Problem Relation Age of Onset  . Hypertension Mother   . Heart disease Father   . Hyperlipidemia Father   . Heart disease Maternal Grandmother   . Heart disease Paternal Grandmother   . Heart disease Paternal Grandfather   . Diabetes Maternal Grandfather      Allergies  Allergen Reactions  . Codeine     REACTION: nausea  . Lipitor (Atorvastatin Calcium)   . Naprosyn (Naproxen)   . Sertraline Hcl     Medication list has been reviewed and updated.  Prior to Admission medications   Medication Sig Start Date End Date Taking? Authorizing Provider  aspirin 325 MG tablet Take 325 mg by mouth as needed.     Historical Provider, MD  Cyanocobalamin (VITAMIN B-12 PO) Take 1 tablet by mouth daily.     Historical Provider, MD  doxycycline (VIBRA-TABS) 100 MG tablet Take 1 tablet (100 mg total) by mouth 2 (two) times daily. 12/02/11 12/12/11  Lamar Laundry, MD  lisinopril (PRINIVIL,ZESTRIL) 10 MG tablet TAKE ONE TABLET BY MOUTH EVERY DAY 02/08/11   Cassell Clement, MD  metFORMIN (GLUCOPHAGE) 1000 MG tablet  05/28/11   Cassell Clement, MD  metFORMIN (GLUCOPHAGE) 1000 MG tablet TAKE ONE TABLET BY MOUTH EVERY DAY 11/21/11   Cassell Clement, MD    Review of Systems:  As per HPI- otherwise negative. She has noted "pieces of meat" in her urine.    Physical Examination: Filed Vitals:   12/10/11 0810  BP: 116/79  Pulse: 106  Temp: 97.7 F (36.5 C)  Resp: 16   Filed Vitals:   12/10/11 0810  Height: 5\' 2"  (1.575 m)  Weight: 193 lb (  87.544 kg)   Body mass index is 35.30 kg/(m^2).  GEN: WDWN, NAD, Non-toxic, A & O x 3, obese HEENT: Atraumatic, Normocephalic. Neck supple. No masses, No LAD.  PEERL, oropharynx wnl Ears and Nose: No external deformity. CV: RRR, No M/G/R. No JVD. No thrill. No extra heart sounds. PULM: CTA B, no wheezes, crackles, rhonchi. No retractions. No resp. distress. No accessory muscle use. ABD: S,  ND, +BS. No rebound. No HSM.  Mild tenderness across bilateral lower abdomen  EXTR: No c/c/e NEURO Normal gait.  PSYCH: Normally interactive. Conversant. Not depressed or anxious appearing.  Calm demeanor.   Results for orders placed in visit on 12/10/11  POCT URINALYSIS DIPSTICK      Component Value Range   Color, UA yellow      Clarity, UA sl. cloudy     Glucose, UA neg     Bilirubin, UA neg     Ketones, UA neg     Spec Grav, UA 1.025     Blood, UA large     pH, UA 6.0     Protein, UA 100     Urobilinogen, UA 0.2     Nitrite, UA positive     Leukocytes, UA moderate (2+)    POCT UA - MICROSCOPIC ONLY      Component Value Range   WBC, Ur, HPF, POC 18-22     RBC, urine, microscopic 8-12     Bacteria, U Microscopic 2+     Mucus, UA neg     Epithelial cells, urine per micros 0-1     Crystals, Ur, HPF, POC neg     Casts, Ur, LPF, POC neg     Yeast, UA neg      Assessment and Plan: 1. UTI (urinary tract infection)  POCT urinalysis dipstick, POCT UA - Microscopic Only, nitrofurantoin, macrocrystal-monohydrate, (MACROBID) 100 MG capsule, Urine culture   Await urine culture, but will treat for UTI with macrobid in the meantime.  Patient (or parent if minor) instructed to return to clinic or call if not better in 2 day(s).   Paarth Cropper, MD 12/10/2011 8:21 AM   addnd 12/13/11:  Received her urine culture: Notes Recorded by Pearline Cables, MD on 12/12/2011 at 3:05 PM Await sensitivity      Component Value    Culture PROTEUS MIRABILIS    Colony Count >=100,000 COLONIES/ML    Organism ID, Bacteria PROTEUS MIRABILIS    Resulting Agency [facility]    Culture & Susceptibility     PROTEUS MIRABILIS       Antibiotic Sensitivity Microscan Status    AMPICILLIN Sensitive <=2 Final    AMPICILLIN/SULBACTAM Sensitive <=2 Final    CEFAZOLIN Sensitive <=4 Final    CEFEPIME Sensitive <=1 Final    CEFOXITIN Sensitive <=4 Final    CEFTAZIDIME Sensitive <=1 Final    CEFTRIAXONE Sensitive <=1 Final    CIPROFLOXACIN Sensitive <=0.25 Final    GENTAMICIN Sensitive <=1 Final    IMIPENEM Intermediate 8 Final    LEVOFLOXACIN Sensitive <=0.12 Final    NITROFURANTOIN Resistant 256 Final    PIP/TAZO Sensitive <=4 Final    TOBRAMYCIN Sensitive <=1 Final    TRIMETH/SULFA Sensitive <=20 Final       Called her - let  her know macrobid will not work!  Change to cipro 250 BID for 5 days- sent in to her pharmacy.  Let me know if not better soon

## 2011-12-13 LAB — URINE CULTURE: Colony Count: >=100000

## 2011-12-13 MED ORDER — CIPROFLOXACIN HCL 250 MG PO TABS: 250.0000 mg | ORAL_TABLET | Freq: Two times a day (BID) | ORAL | Status: AC

## 2011-12-13 NOTE — Progress Notes (Signed)
Addended by: Abbe Amsterdam C on: 12/13/2011 01:22 PM   Modules accepted: Orders

## 2012-01-01 DIAGNOSIS — T148 Other injury of unspecified body region: Secondary | ICD-10-CM | POA: Diagnosis not present

## 2012-01-01 DIAGNOSIS — W57XXXA Bitten or stung by nonvenomous insect and other nonvenomous arthropods, initial encounter: Secondary | ICD-10-CM | POA: Diagnosis not present

## 2012-02-21 ENCOUNTER — Other Ambulatory Visit: Payer: Self-pay | Admitting: Cardiology

## 2012-03-06 ENCOUNTER — Other Ambulatory Visit: Payer: Self-pay | Admitting: *Deleted

## 2012-03-06 DIAGNOSIS — I493 Ventricular premature depolarization: Secondary | ICD-10-CM

## 2012-03-06 DIAGNOSIS — E7800 Pure hypercholesterolemia, unspecified: Secondary | ICD-10-CM

## 2012-03-06 DIAGNOSIS — I10 Essential (primary) hypertension: Secondary | ICD-10-CM

## 2012-03-06 DIAGNOSIS — E119 Type 2 diabetes mellitus without complications: Secondary | ICD-10-CM

## 2012-03-10 ENCOUNTER — Other Ambulatory Visit (INDEPENDENT_AMBULATORY_CARE_PROVIDER_SITE_OTHER): Payer: Medicare Other

## 2012-03-10 ENCOUNTER — Encounter: Payer: Self-pay | Admitting: Cardiology

## 2012-03-10 ENCOUNTER — Ambulatory Visit (INDEPENDENT_AMBULATORY_CARE_PROVIDER_SITE_OTHER): Payer: Medicare Other | Admitting: Cardiology

## 2012-03-10 VITALS — BP 128/80 | HR 80 | Ht 62.0 in | Wt 189.8 lb

## 2012-03-10 DIAGNOSIS — I493 Ventricular premature depolarization: Secondary | ICD-10-CM

## 2012-03-10 DIAGNOSIS — R109 Unspecified abdominal pain: Secondary | ICD-10-CM | POA: Diagnosis not present

## 2012-03-10 DIAGNOSIS — Z1231 Encounter for screening mammogram for malignant neoplasm of breast: Secondary | ICD-10-CM | POA: Diagnosis not present

## 2012-03-10 DIAGNOSIS — I10 Essential (primary) hypertension: Secondary | ICD-10-CM

## 2012-03-10 DIAGNOSIS — K5730 Diverticulosis of large intestine without perforation or abscess without bleeding: Secondary | ICD-10-CM

## 2012-03-10 DIAGNOSIS — E7800 Pure hypercholesterolemia, unspecified: Secondary | ICD-10-CM

## 2012-03-10 DIAGNOSIS — I119 Hypertensive heart disease without heart failure: Secondary | ICD-10-CM | POA: Diagnosis not present

## 2012-03-10 DIAGNOSIS — I4949 Other premature depolarization: Secondary | ICD-10-CM | POA: Diagnosis not present

## 2012-03-10 DIAGNOSIS — IMO0001 Reserved for inherently not codable concepts without codable children: Secondary | ICD-10-CM | POA: Diagnosis not present

## 2012-03-10 DIAGNOSIS — E119 Type 2 diabetes mellitus without complications: Secondary | ICD-10-CM | POA: Diagnosis not present

## 2012-03-10 DIAGNOSIS — R197 Diarrhea, unspecified: Secondary | ICD-10-CM

## 2012-03-10 LAB — CBC WITH DIFFERENTIAL/PLATELET
Basophils Absolute: 0 10*3/uL (ref 0.0–0.1)
Eosinophils Absolute: 0.1 10*3/uL (ref 0.0–0.7)
HCT: 41.4 % (ref 36.0–46.0)
Lymphs Abs: 1.8 10*3/uL (ref 0.7–4.0)
MCHC: 32.8 g/dL (ref 30.0–36.0)
MCV: 92.5 fl (ref 78.0–100.0)
Monocytes Absolute: 0.5 10*3/uL (ref 0.1–1.0)
Monocytes Relative: 7.9 % (ref 3.0–12.0)
Neutro Abs: 3.9 10*3/uL (ref 1.4–7.7)
Platelets: 204 10*3/uL (ref 150.0–400.0)
RDW: 13.8 % (ref 11.5–14.6)

## 2012-03-10 LAB — LIPID PANEL
Cholesterol: 166 mg/dL (ref 0–200)
HDL: 50.5 mg/dL (ref >39.00)
LDL Cholesterol: 95 mg/dL (ref 0–99)
Triglycerides: 105 mg/dL (ref 0.0–149.0)
VLDL: 21 mg/dL (ref 0.0–40.0)

## 2012-03-10 LAB — BASIC METABOLIC PANEL
Chloride: 104 mEq/L (ref 96–112)
GFR: 73.6 mL/min (ref >60.00)
Potassium: 4.2 mEq/L (ref 3.5–5.1)
Sodium: 139 mEq/L (ref 135–145)

## 2012-03-10 LAB — HEPATIC FUNCTION PANEL
ALT: 11 U/L (ref 0–35)
AST: 16 U/L (ref 0–37)
Alkaline Phosphatase: 83 U/L (ref 39–117)
Total Bilirubin: 0.8 mg/dL (ref 0.3–1.2)

## 2012-03-10 LAB — HEMOGLOBIN A1C: Hgb A1c MFr Bld: 6.2 % (ref 4.6–6.5)

## 2012-03-10 NOTE — Assessment & Plan Note (Signed)
The patient has been having loose stools.  She has not tried any Pepto-Bismol because there was a warning about diabetes on the bottle.  She may also try some Imodium over-the-counter. She has not been taking her over-the-counter Prilosec recently but she will restart that as well because she has been having a lot of dyspepsia and heartburn.

## 2012-03-10 NOTE — Assessment & Plan Note (Signed)
The patient reports that because of the recent stress her diabetes has not been as well controlled.  Her blood sugars at home have been in the 140s.  We are checking a hemoglobin A1c today

## 2012-03-10 NOTE — Progress Notes (Signed)
Elaine Oconnell Date of Birth:  [birth year] [facility] [address] [place], [place]  [address] [phone]         Fax   [phone]  History of Present Illness: This pleasant 71 year old woman is seen for a scheduled followup office visit. She has a past history of essential history of hypercholesterolemia. She is a mild diabetic. She has a history of in active sarcoidosis. She has a history of osteoarthritis. She had a past history of palpitations. Recently she's been having more problems with sinus and allergies. She's also had dyspepsia and saw Dr. Juanda Chance who started her on Prilosec.  She has been having a lot of stress the summer caused by a grandson who keeps asking her for money.  She has not been sleeping well because of stress.  Her blood pressure is higher today also.  She has also been having loose stools secondary to being stressed out.   Current Outpatient Prescriptions  Medication Sig Dispense Refill  . aspirin 325 MG tablet Take 325 mg by mouth as needed.       . Cyanocobalamin (VITAMIN B-12 PO) Take 1 tablet by mouth daily.       Marland Kitchen lisinopril (PRINIVIL,ZESTRIL) 10 MG tablet TAKE ONE TABLET BY MOUTH EVERY DAY.  90 tablet  3  . metFORMIN (GLUCOPHAGE) 1000 MG tablet TAKE ONE TABLET BY MOUTH EVERY DAY  90 tablet  3  . DISCONTD: metFORMIN (GLUCOPHAGE) 1000 MG tablet         Allergies  Allergen Reactions  . Codeine     REACTION: nausea  . Lipitor (Atorvastatin Calcium)   . Naprosyn (Naproxen)   . Sertraline Hcl     Patient Active Problem List  Diagnosis  . HYPERTENSION  . GASTRITIS, ANTRAL  . DIVERTICULOSIS OF COLON  . DIARRHEA  . Personal history of sarcoidosis  . Hypertension  . Diabetes mellitus  . Hypercholesterolemia  . Acute bronchitis  . PVC (premature ventricular contraction)  . Abdominal  pain, other specified site    History  Smoking status  . Never Smoker   Smokeless tobacco  . Never Used    History  Alcohol Use No     Family History  Problem Relation Age of Onset  . Hypertension Mother   . Heart disease Father   . Hyperlipidemia Father   . Heart disease Maternal Grandmother   . Heart disease Paternal Grandmother   . Heart disease Paternal Grandfather   . Diabetes Maternal Grandfather     Review of Systems: Constitutional: no fever chills diaphoresis or fatigue or change in weight.  Head and neck: no hearing loss, no epistaxis, no photophobia or visual disturbance. Respiratory: No cough, shortness of breath or wheezing. Cardiovascular: No chest pain peripheral edema, palpitations. Gastrointestinal: No abdominal distention, no abdominal pain, no change in bowel habits hematochezia or melena. Genitourinary: No dysuria, no frequency, no urgency, no nocturia. Musculoskeletal:No arthralgias, no back pain, no gait disturbance or myalgias. Neurological: No dizziness, no headaches, no numbness, no seizures, no syncope, no weakness, no tremors. Hematologic: No lymphadenopathy, no easy bruising. Psychiatric: No confusion, no hallucinations, no sleep disturbance.    Physical Exam: Filed Vitals:   03/10/12 0915  BP: 128/80  Pulse:    the general appearance reveals a well-developed well-nourished woman in no distress.The head and neck exam reveals pupils equal and reactive.  Extraocular movements are full.  There is no scleral icterus.  The mouth and pharynx are normal.  The neck is supple.  The carotids reveal no bruits.  The jugular venous pressure is normal.  The  thyroid is not enlarged.  There is no lymphadenopathy.  The chest is clear to percussion and auscultation.  There are no rales or rhonchi.  Expansion of the chest is symmetrical.  The precordium is quiet.  The first heart sound is normal.  The second heart sound is physiologically split.  There is no murmur gallop rub or click.  There is no abnormal lift or heave.  The abdomen is soft slightly tender just to the left of the epigastrium.  There  is a previous cholecystectomy scar noted.  The bowel sounds are normal.  The liver and spleen are not enlarged.  There are no abdominal masses.  There are no abdominal bruits.  Extremities reveal good pedal pulses.  There is no phlebitis or edema.  There is no cyanosis or clubbing.  Strength is normal and symmetrical in all extremities.  There is no lateralizing weakness.  There are no sensory deficits.  The skin is warm and dry.  There is no rash.     Assessment / Plan: Continue same medication.  We are checking lab work today including a CBC.  Recheck in 4 months for followup office visit lipid panel hepatic function panel basal metabolic panel and hemoglobin R6E.  She will start back on her over-the-counter Prilosec.

## 2012-03-10 NOTE — Assessment & Plan Note (Signed)
Her blood pressure on arrival today was high.  I rechecked it later in the exam and it was improved.  She has not been experiencing any dizzy spells or syncope.  No symptoms of congestive heart failure.

## 2012-03-10 NOTE — Assessment & Plan Note (Signed)
She states that she has had a colonoscopy within the past several years by Dr. Juanda Chance.

## 2012-03-10 NOTE — Progress Notes (Signed)
Quick Note:  Please report to patient. The recent labs are stable. Continue same medication and careful diet. Hgb A1C is better 6.2. ______

## 2012-03-10 NOTE — Patient Instructions (Signed)
Will obtain labs today and call you with the results (lp/bmet/hfp/cbc/a1c)  Restart Omeprazole 20 mg OTC daily  Your physician wants you to follow-up in: 4 months with fasting labs (lp/bmet/hfp)  You will receive a reminder letter in the mail two months in advance. If you don't receive a letter, please call our office to schedule the follow-up appointment.

## 2012-03-13 ENCOUNTER — Telehealth: Payer: Self-pay | Admitting: *Deleted

## 2012-03-13 NOTE — Telephone Encounter (Signed)
Message copied by Burnell Blanks on Fri Mar 13, 2012  5:51 PM ------      Message from: Cassell Clement      Created: Tue Mar 10, 2012  9:06 PM       Please report to patient.  The recent labs are stable. Continue same medication and careful diet. Hgb A1C is better 6.2.

## 2012-03-13 NOTE — Telephone Encounter (Signed)
Mailed copy of labs and left message to call if any questions  

## 2012-03-19 ENCOUNTER — Encounter: Payer: Self-pay | Admitting: Cardiology

## 2012-03-24 DIAGNOSIS — Z23 Encounter for immunization: Secondary | ICD-10-CM | POA: Diagnosis not present

## 2012-06-25 DIAGNOSIS — J322 Chronic ethmoidal sinusitis: Secondary | ICD-10-CM | POA: Diagnosis not present

## 2012-06-25 DIAGNOSIS — H6120 Impacted cerumen, unspecified ear: Secondary | ICD-10-CM | POA: Diagnosis not present

## 2012-08-26 ENCOUNTER — Other Ambulatory Visit (INDEPENDENT_AMBULATORY_CARE_PROVIDER_SITE_OTHER): Payer: Medicare Other

## 2012-08-26 ENCOUNTER — Encounter: Payer: Self-pay | Admitting: Cardiology

## 2012-08-26 ENCOUNTER — Ambulatory Visit (INDEPENDENT_AMBULATORY_CARE_PROVIDER_SITE_OTHER): Payer: Medicare Other | Admitting: Cardiology

## 2012-08-26 VITALS — BP 122/78 | HR 80 | Ht 63.0 in | Wt 189.6 lb

## 2012-08-26 DIAGNOSIS — I493 Ventricular premature depolarization: Secondary | ICD-10-CM

## 2012-08-26 DIAGNOSIS — IMO0001 Reserved for inherently not codable concepts without codable children: Secondary | ICD-10-CM | POA: Diagnosis not present

## 2012-08-26 DIAGNOSIS — Z862 Personal history of diseases of the blood and blood-forming organs and certain disorders involving the immune mechanism: Secondary | ICD-10-CM

## 2012-08-26 DIAGNOSIS — I119 Hypertensive heart disease without heart failure: Secondary | ICD-10-CM

## 2012-08-26 DIAGNOSIS — I10 Essential (primary) hypertension: Secondary | ICD-10-CM | POA: Diagnosis not present

## 2012-08-26 DIAGNOSIS — I4949 Other premature depolarization: Secondary | ICD-10-CM

## 2012-08-26 DIAGNOSIS — Z8639 Personal history of other endocrine, nutritional and metabolic disease: Secondary | ICD-10-CM

## 2012-08-26 MED ORDER — AMITRIPTYLINE HCL 25 MG PO TABS: 25.0000 mg | ORAL_TABLET | Freq: Every day | ORAL | Status: DC

## 2012-08-26 NOTE — Assessment & Plan Note (Signed)
The patient has not been having any hypoglycemic episodes.  We are checking a hemoglobin A1c today

## 2012-08-26 NOTE — Assessment & Plan Note (Signed)
The patient has not been aware of any recent PVCs or palpitations 

## 2012-08-26 NOTE — Progress Notes (Signed)
Elaine Oconnell Date of Birth:  07-31-1940 [facility] [address] [place], [place]  [address] [phone]         Fax   [phone]  History of Present Illness: This pleasant 72 year old woman is seen for a scheduled followup office visit. She has a past history of essential history of hypercholesterolemia. She is a mild diabetic. She has a history of inactive  sarcoidosis. She has a history of osteoarthritis. She had a past history of palpitations. Recently she's been having more problems with sinus and allergies. She's also had dyspepsia and saw Dr. Juanda Chance who started her on Prilosec. She has been having a lot of stress the summer caused by a grandson who keeps asking her for money. She has not been sleeping well because of stress. She has also been having loose stools secondary to being stressed out.   Current Outpatient Prescriptions  Medication Sig Dispense Refill  . aspirin 325 MG tablet Take 325 mg by mouth as needed.       . Cyanocobalamin (VITAMIN B-12 PO) Take 1 tablet by mouth daily.       Marland Kitchen lisinopril (PRINIVIL,ZESTRIL) 10 MG tablet TAKE ONE TABLET BY MOUTH EVERY DAY.  90 tablet  3  . metFORMIN (GLUCOPHAGE) 1000 MG tablet TAKE ONE TABLET BY MOUTH EVERY DAY  90 tablet  3  . amitriptyline (ELAVIL) 25 MG tablet Take 1 tablet (25 mg total) by mouth at bedtime.  30 tablet  5   No current facility-administered medications for this visit.    Allergies  Allergen Reactions  . Codeine     REACTION: nausea  . Lipitor (Atorvastatin Calcium)   . Naprosyn (Naproxen)   . Sertraline Hcl     Patient Active Problem List  Diagnosis  . HYPERTENSION  . GASTRITIS, ANTRAL  . DIVERTICULOSIS OF COLON  . DIARRHEA  . Personal history of sarcoidosis  . Hypertension  . Type II or unspecified type diabetes mellitus without mention of complication, uncontrolled  . Hypercholesterolemia  . Acute bronchitis  . PVC (premature ventricular contraction)  . Abdominal   pain, other specified site    History  Smoking status  . Never Smoker   Smokeless tobacco  . Never Used    History  Alcohol Use No    Family History  Problem Relation Age of Onset  . Hypertension Mother   . Heart disease Father   . Hyperlipidemia Father   . Heart disease Maternal Grandmother   . Heart disease Paternal Grandmother   . Heart disease Paternal Grandfather   . Diabetes Maternal Grandfather     Review of Systems: Constitutional: no fever chills diaphoresis or fatigue or change in weight.  Head and neck: no hearing loss, no epistaxis, no photophobia or visual disturbance. Respiratory: No cough, shortness of breath or wheezing. Cardiovascular: No chest pain peripheral edema, palpitations. Gastrointestinal: No abdominal distention, no abdominal pain, no change in bowel habits hematochezia or melena. Genitourinary: No dysuria, no frequency, no urgency, no nocturia. Musculoskeletal:No arthralgias, no back pain, no gait disturbance or myalgias. Neurological: No dizziness, no headaches, no numbness, no seizures, no syncope, no weakness, no tremors. Hematologic: No lymphadenopathy, no easy bruising. Psychiatric: No confusion, no hallucinations, no sleep disturbance.    Physical Exam: Filed Vitals:   08/26/12 1540  BP: 122/78  Pulse: 80   the general appearance reveals a somewhat distraught middle-aged woman in no acute distress.The head and neck exam reveals pupils equal and reactive.  Extraocular movements are  full.  There is no scleral icterus.  The mouth and pharynx are normal.  The neck is supple.  The carotids reveal no bruits.  The jugular venous pressure is normal.  The  thyroid is not enlarged.  There is no lymphadenopathy.  The chest is clear to percussion and auscultation.  There are no rales or rhonchi.  Expansion of the chest is symmetrical.  The precordium is quiet.  The first heart sound is normal.  The second heart sound is physiologically split.  There  is no murmur gallop rub or click.  There is no abnormal lift or heave.  The abdomen is soft and nontender.  The bowel sounds are normal.  The liver and spleen are not enlarged.  There are no abdominal masses.  There are no abdominal bruits.  Extremities reveal good pedal pulses.  There is no phlebitis or edema.  There is no cyanosis or clubbing.  Strength is normal and symmetrical in all extremities.  There is no lateralizing weakness.  There are no sensory deficits.  The skin is warm and dry.  There is no rash.    Assessment / Plan: Continue same medication.  Restart amitriptyline 25 mg at bedtime.  Blood work today pending. Recheck in 4 months for followup office visit and fasting lab work.

## 2012-08-26 NOTE — Assessment & Plan Note (Signed)
Today her blood pressure was normal which is surprising because she states that she is very stressed out because of family problems involving her daughter-in-law and involving the girlfriend of her grandson.  The patient has not been resting well.  In the past she has used amitriptyline successfully and we will restart that.

## 2012-08-26 NOTE — Patient Instructions (Addendum)
Will obtain labs today and call you with the results (lp/bmet/hfp/a1c)  Start Amitriptyline 25 mg at bedtime   Your physician recommends that you schedule a follow-up appointment in: 4 months with fasting labs (lp/bmet/hfp)

## 2012-08-26 NOTE — Assessment & Plan Note (Signed)
The patient has not been experiencing any cough or sputum production or any symptoms of sarcoidosis

## 2012-08-27 LAB — LIPID PANEL
Cholesterol: 171 mg/dL (ref 0–200)
VLDL: 14.2 mg/dL (ref 0.0–40.0)

## 2012-08-27 LAB — BASIC METABOLIC PANEL
BUN: 22 mg/dL (ref 6–23)
CO2: 28 mEq/L (ref 19–32)
Calcium: 9.3 mg/dL (ref 8.4–10.5)
Creatinine, Ser: 0.9 mg/dL (ref 0.4–1.2)

## 2012-08-27 LAB — HEPATIC FUNCTION PANEL
AST: 20 U/L (ref 0–37)
Albumin: 3.9 g/dL (ref 3.5–5.2)

## 2012-08-27 LAB — HEMOGLOBIN A1C: Hgb A1c MFr Bld: 6.4 % (ref 4.6–6.5)

## 2012-08-27 NOTE — Progress Notes (Signed)
Quick Note:  Please report to patient. The recent labs are stable. Continue same medication and careful diet. A1C 6.4 good ______

## 2012-09-02 ENCOUNTER — Telehealth: Payer: Self-pay | Admitting: *Deleted

## 2012-09-02 NOTE — Telephone Encounter (Signed)
Message copied by Burnell Blanks on Wed Sep 02, 2012  9:46 AM ------      Message from: Cassell Clement      Created: Thu Aug 27, 2012  8:13 PM       Please report to patient.  The recent labs are stable. Continue same medication and careful diet. A1C 6.4 good ------

## 2012-09-02 NOTE — Telephone Encounter (Signed)
Mailed copy of labs and left message to call if any questions  

## 2012-09-17 DIAGNOSIS — T148 Other injury of unspecified body region: Secondary | ICD-10-CM | POA: Diagnosis not present

## 2012-09-17 DIAGNOSIS — W57XXXA Bitten or stung by nonvenomous insect and other nonvenomous arthropods, initial encounter: Secondary | ICD-10-CM | POA: Diagnosis not present

## 2012-11-27 ENCOUNTER — Telehealth: Payer: Self-pay | Admitting: Cardiology

## 2012-11-27 MED ORDER — METFORMIN HCL 1000 MG PO TABS: ORAL_TABLET | ORAL | Status: DC

## 2012-11-27 NOTE — Telephone Encounter (Signed)
Refilled as requested  

## 2012-11-27 NOTE — Telephone Encounter (Signed)
New Prob     Pt is calling in about METFORMIN prescription. Please call,

## 2012-12-22 ENCOUNTER — Other Ambulatory Visit (INDEPENDENT_AMBULATORY_CARE_PROVIDER_SITE_OTHER): Payer: Medicare Other

## 2012-12-22 ENCOUNTER — Encounter: Payer: Self-pay | Admitting: Cardiology

## 2012-12-22 ENCOUNTER — Ambulatory Visit (INDEPENDENT_AMBULATORY_CARE_PROVIDER_SITE_OTHER): Payer: Medicare Other | Admitting: Cardiology

## 2012-12-22 VITALS — BP 128/74 | HR 84 | Ht 63.0 in | Wt 194.8 lb

## 2012-12-22 DIAGNOSIS — F329 Major depressive disorder, single episode, unspecified: Secondary | ICD-10-CM | POA: Diagnosis not present

## 2012-12-22 DIAGNOSIS — IMO0001 Reserved for inherently not codable concepts without codable children: Secondary | ICD-10-CM | POA: Diagnosis not present

## 2012-12-22 DIAGNOSIS — I119 Hypertensive heart disease without heart failure: Secondary | ICD-10-CM

## 2012-12-22 DIAGNOSIS — E119 Type 2 diabetes mellitus without complications: Secondary | ICD-10-CM

## 2012-12-22 DIAGNOSIS — I10 Essential (primary) hypertension: Secondary | ICD-10-CM | POA: Diagnosis not present

## 2012-12-22 DIAGNOSIS — F32A Depression, unspecified: Secondary | ICD-10-CM

## 2012-12-22 DIAGNOSIS — E7800 Pure hypercholesterolemia, unspecified: Secondary | ICD-10-CM

## 2012-12-22 MED ORDER — CITALOPRAM HYDROBROMIDE 20 MG PO TABS: 20.0000 mg | ORAL_TABLET | Freq: Every day | ORAL | Status: DC

## 2012-12-22 NOTE — Assessment & Plan Note (Signed)
The patient has a history of mild diabetes.  She is not having any hypoglycemic episodes.  She remains on metformin without complications or side effects.

## 2012-12-22 NOTE — Assessment & Plan Note (Signed)
Patient has a history of hypercholesterolemia.  She has not required statin therapy at this point.  She is intolerant of Lipitor.  Blood work is pending.

## 2012-12-22 NOTE — Patient Instructions (Addendum)
Your physician wants you to follow-up in: 4 months  You will receive a reminder letter in the mail two months in advance. If you don't receive a letter, please call our office to schedule the follow-up appointment.  Your physician has recommended you make the following change in your medication:  Start citalopram 20 mg daily

## 2012-12-22 NOTE — Progress Notes (Signed)
Elaine Oconnell Date of Birth:  1941-02-16 [facility] [address] [place], [place]  [address] [phone]         Fax   [phone]  History of Present Illness: This pleasant 71 year old woman is seen for a scheduled followup office visit. She has a past history of essential history of hypercholesterolemia. She is a mild diabetic. She has a history of inactive sarcoidosis. She has a history of osteoarthritis. She had a past history of palpitations. Recently she's been having more problems with sinus and allergies. She's also had dyspepsia and saw Dr. Juanda Chance who started her on Prilosec. She has been having a lot of stress the summer caused by a grandson who keeps asking her for money. She has not been sleeping well because of stress. She has also been having loose stools secondary to being stressed out.  She admits to being depressed.  Current Outpatient Prescriptions  Medication Sig Dispense Refill  . aspirin 325 MG tablet Take 325 mg by mouth as needed.       Marland Kitchen lisinopril (PRINIVIL,ZESTRIL) 10 MG tablet TAKE ONE TABLET BY MOUTH EVERY DAY.  90 tablet  3  . metFORMIN (GLUCOPHAGE) 1000 MG tablet TAKE ONE TABLET BY MOUTH EVERY DAY  90 tablet  3  . OVER THE COUNTER MEDICATION 1 capsule daily. Extra strength omega Q      . amitriptyline (ELAVIL) 25 MG tablet Take 1 tablet (25 mg total) by mouth at bedtime.  30 tablet  5  . citalopram (CELEXA) 20 MG tablet Take 1 tablet (20 mg total) by mouth daily.  30 tablet  5  . Cyanocobalamin (VITAMIN B-12 PO) Take 1 tablet by mouth daily.        No current facility-administered medications for this visit.    Allergies  Allergen Reactions  . Codeine     REACTION: nausea  . Lipitor (Atorvastatin Calcium)   . Naprosyn (Naproxen)   . Sertraline Hcl     Patient Active Problem List   Diagnosis Date Noted  . Acute bronchitis 12/12/2010    Priority: High  . Type II or unspecified type diabetes mellitus without mention of  complication, uncontrolled     Priority: High  . HYPERTENSION 12/08/2009    Priority: High  . Depression 12/22/2012  . Abdominal  pain, other specified site 07/30/2011  . PVC (premature ventricular contraction) 05/20/2011  . Personal history of sarcoidosis   . Hypertension   . Hypercholesterolemia   . GASTRITIS, ANTRAL 12/08/2009  . DIVERTICULOSIS OF COLON 12/08/2009  . DIARRHEA 12/08/2009    History  Smoking status  . Never Smoker   Smokeless tobacco  . Never Used    History  Alcohol Use No    Family History  Problem Relation Age of Onset  . Hypertension Mother   . Heart disease Father   . Hyperlipidemia Father   . Heart disease Maternal Grandmother   . Heart disease Paternal Grandmother   . Heart disease Paternal Grandfather   . Diabetes Maternal Grandfather     Review of Systems: Constitutional: no fever chills diaphoresis or fatigue or change in weight.  Head and neck: no hearing loss, no epistaxis, no photophobia or visual disturbance. Respiratory: No cough, shortness of breath or wheezing. Cardiovascular: No chest pain peripheral edema, palpitations. Gastrointestinal: No abdominal distention, no abdominal pain, no change in bowel habits hematochezia or melena. Genitourinary: No dysuria, no frequency, no urgency, no nocturia. Musculoskeletal:No arthralgias, no back pain, no gait disturbance or  myalgias. Neurological: No dizziness, no headaches, no numbness, no seizures, no syncope, no weakness, no tremors. Hematologic: No lymphadenopathy, no easy bruising. Psychiatric: No confusion, no hallucinations, no sleep disturbance.    Physical Exam: Filed Vitals:   12/22/12 0928  BP: 128/74  Pulse: 84   the general appearance is that of a depressed middle-aged woman in no acute distress.The head and neck exam reveals pupils equal and reactive.  Extraocular movements are full.  There is no scleral icterus.  The mouth and pharynx are normal.  The neck is supple.  The  carotids reveal no bruits.  The jugular venous pressure is normal.  The  thyroid is not enlarged.  There is no lymphadenopathy.  The chest is clear to percussion and auscultation.  There are no rales or rhonchi.  Expansion of the chest is symmetrical.  The precordium is quiet.  The first heart sound is normal.  The second heart sound is physiologically split.  There is no murmur gallop rub or click.  There is no abnormal lift or heave.  The abdomen is soft and nontender.  The bowel sounds are normal.  The liver and spleen are not enlarged.  There are no abdominal masses.  There are no abdominal bruits.  Extremities reveal good pedal pulses.  There is no phlebitis or edema.  There is no cyanosis or clubbing.  Strength is normal and symmetrical in all extremities.  There is no lateralizing weakness.  There are no sensory deficits.  The skin is warm and dry.  There is no rash.    Assessment / Plan: Continue on same medication.  Recheck in 4 months for followup office visit EKG lipid panel hepatic function panel basal metabolic panel and hemoglobin L2G. Trial of citalopram 20 mg daily for situational depression.

## 2012-12-22 NOTE — Assessment & Plan Note (Signed)
The patient is depressed.  This is a situational depression related to  family relationships.  We will add citalopram 20 mg one daily to help her with the depression.

## 2012-12-22 NOTE — Assessment & Plan Note (Signed)
Patient has a history of hypertension.  She is on lisinopril.  He denies any headaches or dizziness.  She is not having symptoms of CHF

## 2012-12-23 LAB — HEPATIC FUNCTION PANEL
Albumin: 4 g/dL (ref 3.5–5.2)
Alkaline Phosphatase: 81 U/L (ref 39–117)

## 2012-12-23 LAB — LIPID PANEL
LDL Cholesterol: 109 mg/dL — ABNORMAL HIGH (ref 0–99)
Total CHOL/HDL Ratio: 3
Triglycerides: 122 mg/dL (ref 0.0–149.0)
VLDL: 24.4 mg/dL (ref 0.0–40.0)

## 2012-12-23 LAB — BASIC METABOLIC PANEL
CO2: 26 mEq/L (ref 19–32)
Chloride: 99 mEq/L (ref 96–112)
Creatinine, Ser: 0.9 mg/dL (ref 0.4–1.2)
Potassium: 4 mEq/L (ref 3.5–5.1)
Sodium: 135 mEq/L (ref 135–145)

## 2012-12-23 NOTE — Progress Notes (Signed)
Quick Note:  Please report to patient. The recent labs are stable. Continue same medication and careful diet. BS 116. Watch carbs. ______

## 2013-02-26 ENCOUNTER — Other Ambulatory Visit: Payer: Self-pay | Admitting: Cardiology

## 2013-03-23 DIAGNOSIS — Z23 Encounter for immunization: Secondary | ICD-10-CM | POA: Diagnosis not present

## 2013-04-22 DIAGNOSIS — Z1231 Encounter for screening mammogram for malignant neoplasm of breast: Secondary | ICD-10-CM | POA: Diagnosis not present

## 2013-04-22 LAB — HM MAMMOGRAPHY

## 2013-05-19 ENCOUNTER — Encounter: Payer: Self-pay | Admitting: Internal Medicine

## 2013-05-19 ENCOUNTER — Other Ambulatory Visit (INDEPENDENT_AMBULATORY_CARE_PROVIDER_SITE_OTHER): Payer: Medicare Other

## 2013-05-19 ENCOUNTER — Ambulatory Visit (INDEPENDENT_AMBULATORY_CARE_PROVIDER_SITE_OTHER): Payer: Medicare Other | Admitting: Internal Medicine

## 2013-05-19 ENCOUNTER — Telehealth: Payer: Self-pay

## 2013-05-19 VITALS — BP 128/82 | HR 90 | Temp 99.0°F | Ht 63.0 in | Wt 198.0 lb

## 2013-05-19 DIAGNOSIS — E7800 Pure hypercholesterolemia, unspecified: Secondary | ICD-10-CM | POA: Diagnosis not present

## 2013-05-19 DIAGNOSIS — IMO0001 Reserved for inherently not codable concepts without codable children: Secondary | ICD-10-CM

## 2013-05-19 DIAGNOSIS — I10 Essential (primary) hypertension: Secondary | ICD-10-CM

## 2013-05-19 DIAGNOSIS — F329 Major depressive disorder, single episode, unspecified: Secondary | ICD-10-CM

## 2013-05-19 DIAGNOSIS — Z0000 Encounter for general adult medical examination without abnormal findings: Secondary | ICD-10-CM | POA: Diagnosis not present

## 2013-05-19 DIAGNOSIS — F32A Depression, unspecified: Secondary | ICD-10-CM

## 2013-05-19 LAB — HEMOGLOBIN A1C: Hgb A1c MFr Bld: 6.8 % — ABNORMAL HIGH (ref 4.6–6.5)

## 2013-05-19 LAB — BASIC METABOLIC PANEL
BUN: 17 mg/dL (ref 6–23)
Chloride: 103 mEq/L (ref 96–112)
Creatinine, Ser: 1 mg/dL (ref 0.4–1.2)
GFR: 58.88 mL/min — ABNORMAL LOW (ref >60.00)
Potassium: 4.4 mEq/L (ref 3.5–5.1)

## 2013-05-19 MED ORDER — METFORMIN HCL 1000 MG PO TABS: ORAL_TABLET | ORAL | Status: DC

## 2013-05-19 MED ORDER — LISINOPRIL 10 MG PO TABS: 10.0000 mg | ORAL_TABLET | Freq: Every day | ORAL | Status: DC

## 2013-05-19 NOTE — Progress Notes (Signed)
Subjective:    Patient ID: Elaine Oconnell, female    DOB: [birth year], 72 y.o.   MRN: [id number]  HPI New patient to me, here to establish with primary care provider -until now, primary care needs have been addressed by her cardiologist   Here for medicare wellness  Diet: heart healthy, diabetic Physical activity: sedentary Depression/mood screen: see above Hearing: intact to whispered voice Visual acuity: grossly normal, performs annual eye exam  ADLs: capable Fall risk: none Home safety: good Cognitive evaluation: intact to orientation, naming, recall and repetition EOL planning: adv directives, full code/ I agree  I have personally reviewed and have noted 1. The patient's medical and social history 2. Their use of alcohol, tobacco or illicit drugs 3. Their current medications and supplements 4. The patient's functional ability including ADL's, fall risks, home safety risks and hearing or visual impairment. 5. Diet and physical activities 6. Evidence for depression or mood disorders   Reviewed chronic medical issues:  Type 2 diabetes. Takes metformin for same. On ACE inhibitor and aspirin therapy. Intolerant of prior atorvastatin trial. Reports home CBGs <120, checks infreq  Hypertension. On ACE inhibitor, denies side effects or problems. Reports compliance as prescribed  Depression. Situational in past 6 months. Trial of SSRI therapy the cardiology summer 2014 not well tolerated. Denies si/hi  Past Medical History  Diagnosis Date  . Personal history of sarcoidosis   . Hypertension   . Hypercholesterolemia   . Diverticulosis   . Obesity   . GERD (gastroesophageal reflux disease)     with HH  . Type II or unspecified type diabetes mellitus without mention of complication, uncontrolled   . PVC (premature ventricular contraction)   . Depression    Family History  Problem Relation Age of Onset  . Hypertension Mother   . Heart disease Father   . Hyperlipidemia  Father   . Heart disease Maternal Grandmother   . Heart disease Paternal Grandmother   . Heart disease Paternal Grandfather   . Diabetes Maternal Grandfather    History  Substance Use Topics  . Smoking status: Never Smoker   . Smokeless tobacco: Never Used  . Alcohol Use: No     Review of Systems  Constitutional: Negative for fatigue and unexpected weight change.  Respiratory: Negative for cough, shortness of breath and wheezing.   Cardiovascular: Negative for chest pain, palpitations and leg swelling.  Gastrointestinal: Negative for nausea, abdominal pain and diarrhea.  Neurological: Negative for dizziness, weakness, light-headedness and headaches.  Psychiatric/Behavioral: Positive for dysphoric mood. Negative for behavioral problems and decreased concentration. The patient is nervous/anxious.   All other systems reviewed and are negative.       Objective:   Physical Exam BP 128/82  Pulse 90  Temp(Src) 99 F (37.2 C) (Oral)  Ht 5\' 3"  (1.6 m)  Wt 198 lb (89.812 kg)  BMI 35.08 kg/m2  SpO2 94% Wt Readings from Last 3 Encounters:  05/19/13 198 lb (89.812 kg)  12/22/12 194 lb 12.8 oz (88.361 kg)  08/26/12 189 lb 9.6 oz (86.002 kg)   Constitutional: She is obese, but appears well-developed and well-nourished. No distress.  HENT: Head: Normocephalic and atraumatic. Ears: B TMs ok, no erythema or effusion; Nose: Nose normal. Mouth/Throat: Oropharynx is clear and moist. No oropharyngeal exudate.  Eyes: Conjunctivae and EOM are normal. Pupils are equal, round, and reactive to light. No scleral icterus.  Neck: Normal range of motion. Neck supple. No JVD present. No thyromegaly present.  Cardiovascular: Normal rate, regular  rhythm and normal heart sounds.  No murmur heard. No BLE edema. Pulmonary/Chest: Effort normal and breath sounds normal. No respiratory distress. She has no wheezes.  Abdominal: Soft. Bowel sounds are normal. She exhibits no distension. There is no tenderness.  no masses Musculoskeletal: Normal range of motion, no joint effusions. No gross deformities Neurological: She is alert and oriented to person, place, and time. No cranial nerve deficit. Coordination, balance, strength, speech and gait are normal.  Skin: Skin is warm and dry. No rash noted. No erythema.  Psychiatric: She has a anxious and occ dysphoric/tearful mood and affect. Her behavior is normal. Judgment and thought content normal.   Lab Results  Component Value Date   WBC 6.3 03/10/2012   HGB 13.6 03/10/2012   HCT 41.4 03/10/2012   PLT 204.0 03/10/2012   GLUCOSE 116* 12/22/2012   CHOL 189 12/22/2012   TRIG 122.0 12/22/2012   HDL 55.70 12/22/2012   LDLCALC 109* 12/22/2012   ALT 13 12/22/2012   AST 17 12/22/2012   NA 135 12/22/2012   K 4.0 12/22/2012   CL 99 12/22/2012   CREATININE 0.9 12/22/2012   BUN 21 12/22/2012   CO2 26 12/22/2012   HGBA1C 6.4 08/26/2012        Assessment & Plan:   CPX/AWV/v70.0 - Today patient counseled on age appropriate routine health concerns for screening and prevention, each reviewed and up to date or declined. Immunizations reviewed and up to date or declined. Labs/ECG reviewed. Risk factors for depression reviewed and negative. Hearing function and visual acuity are intact. ADLs screened and addressed as needed. Functional ability and level of safety reviewed and appropriate. Education, counseling and referrals performed based on assessed risks today. Patient provided with a copy of personalized plan for preventive services.  Also see problem list. Medications and labs reviewed today.

## 2013-05-19 NOTE — Assessment & Plan Note (Signed)
On metformin for same Infrequent checking CBGs, reports under 120 Check a1c The patient is asked to make an attempt to improve diet and exercise patterns to aid in medical management of this problem. Lab Results  Component Value Date   HGBA1C 6.4 08/26/2012

## 2013-05-19 NOTE — Telephone Encounter (Signed)
Patient was called and informed of labs. Patient would like labs mailed out to her.

## 2013-05-19 NOTE — Progress Notes (Signed)
Pre-visit discussion using our clinic review tool. No additional management support is needed unless otherwise documented below in the visit note.  

## 2013-05-19 NOTE — Assessment & Plan Note (Signed)
BP Readings from Last 3 Encounters:  05/19/13 128/82  12/22/12 128/74  08/26/12 122/78   The current medical regimen is effective;  continue present plan and medications.

## 2013-05-19 NOTE — Patient Instructions (Addendum)
It was good to see you today.  We have reviewed your prior records including labs and tests today  Health Maintenance reviewed - all recommended immunizations and age-appropriate screenings are up-to-date.  Test(s) ordered today. Your results will be released to MyChart (or called to you) after review, usually within 72hours after test completion. If any changes need to be made, you will be notified at that same time.  Medications reviewed and updated, no changes recommended at this time. Refill on medication(s) as discussed today.  we'll make referral to behvioral health for counseling and therapy as discussed. Our office will contact you regarding appointment(s) once made.  If depression symptoms worse and you want to reconsider alternate medication therapy, please contact his office for assistance as needed  Please schedule followup in 6-12 months for diabetes mellitus check and review, call sooner if problems.  Health Maintenance, Female A healthy lifestyle and preventative care can promote health and wellness.  Maintain regular health, dental, and eye exams.  Eat a healthy diet. Foods like vegetables, fruits, whole grains, low-fat dairy products, and lean protein foods contain the nutrients you need without too many calories. Decrease your intake of foods high in solid fats, added sugars, and salt. Get information about a proper diet from your caregiver, if necessary.  Regular physical exercise is one of the most important things you can do for your health. Most adults should get at least 150 minutes of moderate-intensity exercise (any activity that increases your heart rate and causes you to sweat) each week. In addition, most adults need muscle-strengthening exercises on 2 or more days a week.   Maintain a healthy weight. The body mass index (BMI) is a screening tool to identify possible weight problems. It provides an estimate of body fat based on height and weight. Your caregiver  can help determine your BMI, and can help you achieve or maintain a healthy weight. For adults 20 years and older:  A BMI below 18.5 is considered underweight.  A BMI of 18.5 to 24.9 is normal.  A BMI of 25 to 29.9 is considered overweight.  A BMI of 30 and above is considered obese.  Maintain normal blood lipids and cholesterol by exercising and minimizing your intake of saturated fat. Eat a balanced diet with plenty of fruits and vegetables. Blood tests for lipids and cholesterol should begin at age 54 and be repeated every 5 years. If your lipid or cholesterol levels are high, you are over 50, or you are a high risk for heart disease, you may need your cholesterol levels checked more frequently.Ongoing high lipid and cholesterol levels should be treated with medicines if diet and exercise are not effective.  If you smoke, find out from your caregiver how to quit. If you do not use tobacco, do not start.  Lung cancer screening is recommended for adults aged 49 80 years who are at high risk for developing lung cancer because of a history of smoking. Yearly low-dose computed tomography (CT) is recommended for people who have at least a 30-pack-year history of smoking and are a current smoker or have quit within the past 15 years. A pack year of smoking is smoking an average of 1 pack of cigarettes a day for 1 year (for example: 1 pack a day for 30 years or 2 packs a day for 15 years). Yearly screening should continue until the smoker has stopped smoking for at least 15 years. Yearly screening should also be stopped for people who develop  a health problem that would prevent them from having lung cancer treatment.  If you are pregnant, do not drink alcohol. If you are breastfeeding, be very cautious about drinking alcohol. If you are not pregnant and choose to drink alcohol, do not exceed 1 drink per day. One drink is considered to be 12 ounces (355 mL) of beer, 5 ounces (148 mL) of wine, or 1.5  ounces (44 mL) of liquor.  Avoid use of street drugs. Do not share needles with anyone. Ask for help if you need support or instructions about stopping the use of drugs.  High blood pressure causes heart disease and increases the risk of stroke. Blood pressure should be checked at least every 1 to 2 years. Ongoing high blood pressure should be treated with medicines, if weight loss and exercise are not effective.  If you are 79 to 74 years old, ask your caregiver if you should take aspirin to prevent strokes.  Diabetes screening involves taking a blood sample to check your fasting blood sugar level. This should be done once every 3 years, after age 32, if you are within normal weight and without risk factors for diabetes. Testing should be considered at a younger age or be carried out more frequently if you are overweight and have at least 1 risk factor for diabetes.  Breast cancer screening is essential preventative care for women. You should practice "breast self-awareness." This means understanding the normal appearance and feel of your breasts and may include breast self-examination. Any changes detected, no matter how small, should be reported to a caregiver. Women in their 64s and 30s should have a clinical breast exam (CBE) by a caregiver as part of a regular health exam every 1 to 3 years. After age 44, women should have a CBE every year. Starting at age 60, women should consider having a mammogram (breast X-ray) every year. Women who have a family history of breast cancer should talk to their caregiver about genetic screening. Women at a high risk of breast cancer should talk to their caregiver about having an MRI and a mammogram every year.  Breast cancer gene (BRCA)-related cancer risk assessment is recommended for women who have family members with BRCA-related cancers. BRCA-related cancers include breast, ovarian, tubal, and peritoneal cancers. Having family members with these cancers may be  associated with an increased risk for harmful changes (mutations) in the breast cancer genes BRCA1 and BRCA2. Results of the assessment will determine the need for genetic counseling and BRCA1 and BRCA2 testing.  The Pap test is a screening test for cervical cancer. Women should have a Pap test starting at age 66. Between ages 1 and 68, Pap tests should be repeated every 2 years. Beginning at age 66, you should have a Pap test every 3 years as long as the past 3 Pap tests have been normal. If you had a hysterectomy for a problem that was not cancer or a condition that could lead to cancer, then you no longer need Pap tests. If you are between ages 2 and 66, and you have had normal Pap tests going back 10 years, you no longer need Pap tests. If you have had past treatment for cervical cancer or a condition that could lead to cancer, you need Pap tests and screening for cancer for at least 20 years after your treatment. If Pap tests have been discontinued, risk factors (such as a new sexual partner) need to be reassessed to determine if screening should be  resumed. Some women have medical problems that increase the chance of getting cervical cancer. In these cases, your caregiver may recommend more frequent screening and Pap tests.  The human papillomavirus (HPV) test is an additional test that may be used for cervical cancer screening. The HPV test looks for the virus that can cause the cell changes on the cervix. The cells collected during the Pap test can be tested for HPV. The HPV test could be used to screen women aged 55 years and older, and should be used in women of any age who have unclear Pap test results. After the age of 77, women should have HPV testing at the same frequency as a Pap test.  Colorectal cancer can be detected and often prevented. Most routine colorectal cancer screening begins at the age of 85 and continues through age 31. However, your caregiver may recommend screening at an  earlier age if you have risk factors for colon cancer. On a yearly basis, your caregiver may provide home test kits to check for hidden blood in the stool. Use of a small camera at the end of a tube, to directly examine the colon (sigmoidoscopy or colonoscopy), can detect the earliest forms of colorectal cancer. Talk to your caregiver about this at age 44, when routine screening begins. Direct examination of the colon should be repeated every 5 to 10 years through age 28, unless early forms of pre-cancerous polyps or small growths are found.  Hepatitis C blood testing is recommended for all people born from [birth year] through 1967 and any individual with known risks for hepatitis C.  Practice safe sex. Use condoms and avoid high-risk sexual practices to reduce the spread of sexually transmitted infections (STIs). Sexually active women aged 32 and younger should be checked for Chlamydia, which is a common sexually transmitted infection. Older women with new or multiple partners should also be tested for Chlamydia. Testing for other STIs is recommended if you are sexually active and at increased risk.  Osteoporosis is a disease in which the bones lose minerals and strength with aging. This can result in serious bone fractures. The risk of osteoporosis can be identified using a bone density scan. Women ages 99 and over and women at risk for fractures or osteoporosis should discuss screening with their caregivers. Ask your caregiver whether you should be taking a calcium supplement or vitamin D to reduce the rate of osteoporosis.  Menopause can be associated with physical symptoms and risks. Hormone replacement therapy is available to decrease symptoms and risks. You should talk to your caregiver about whether hormone replacement therapy is right for you.  Use sunscreen. Apply sunscreen liberally and repeatedly throughout the day. You should seek shade when your shadow is shorter than you. Protect yourself by  wearing long sleeves, pants, a wide-brimmed hat, and sunglasses year round, whenever you are outdoors.  Notify your caregiver of new moles or changes in moles, especially if there is a change in shape or color. Also notify your caregiver if a mole is larger than the size of a pencil eraser.  Stay current with your immunizations. Document Released: 01/07/2011 Document Revised: 10/19/2012 Document Reviewed: 01/07/2011 [facility] Patient Information 2014 [place], [place].

## 2013-05-19 NOTE — Assessment & Plan Note (Signed)
Situational exacerbation due to family and financial stressors Trial SSRI Celexa summer of 2014 poorly tolerated because of side effects Will refer to behavioral health for counseling support Verified no SI/HI Patient will call symptoms worse or unimproved reconsider alternate therapy if needed

## 2013-05-19 NOTE — Telephone Encounter (Signed)
Message copied by Eulis Manly on Wed May 19, 2013  5:45 PM ------      Message from: Rene Paci A      Created: Wed May 19, 2013  5:35 PM       Please call patient if not on MyChart:      I have reviewed all lab results which are normal or stable. No treatment changes recommended. Let me know if there are questions or problems. Thanks ------

## 2013-05-19 NOTE — Assessment & Plan Note (Signed)
Refuses statin because of adverse reaction to Lipitor in the past Check annually, Encourage diet and weight control for management of same 

## 2013-05-20 NOTE — Telephone Encounter (Signed)
Mail labs to pt address...lmb

## 2013-07-26 DIAGNOSIS — J040 Acute laryngitis: Secondary | ICD-10-CM | POA: Diagnosis not present

## 2013-08-03 DIAGNOSIS — J040 Acute laryngitis: Secondary | ICD-10-CM | POA: Diagnosis not present

## 2013-09-16 DIAGNOSIS — J40 Bronchitis, not specified as acute or chronic: Secondary | ICD-10-CM | POA: Diagnosis not present

## 2013-09-20 ENCOUNTER — Encounter: Payer: Self-pay | Admitting: Internal Medicine

## 2013-09-20 ENCOUNTER — Ambulatory Visit (INDEPENDENT_AMBULATORY_CARE_PROVIDER_SITE_OTHER): Payer: Medicare Other | Admitting: Internal Medicine

## 2013-09-20 ENCOUNTER — Other Ambulatory Visit (INDEPENDENT_AMBULATORY_CARE_PROVIDER_SITE_OTHER): Payer: Medicare Other

## 2013-09-20 ENCOUNTER — Ambulatory Visit (INDEPENDENT_AMBULATORY_CARE_PROVIDER_SITE_OTHER)
Admission: RE | Admit: 2013-09-20 | Discharge: 2013-09-20 | Disposition: A | Discharge status: Home / Self Care | Payer: Medicare Other | Source: Ambulatory Visit | Attending: Internal Medicine | Admitting: Internal Medicine

## 2013-09-20 VITALS — BP 118/70 | HR 82 | Temp 98.3°F | Wt 203.8 lb

## 2013-09-20 DIAGNOSIS — IMO0001 Reserved for inherently not codable concepts without codable children: Secondary | ICD-10-CM | POA: Diagnosis not present

## 2013-09-20 DIAGNOSIS — J209 Acute bronchitis, unspecified: Secondary | ICD-10-CM | POA: Diagnosis not present

## 2013-09-20 DIAGNOSIS — E1165 Type 2 diabetes mellitus with hyperglycemia: Secondary | ICD-10-CM

## 2013-09-20 DIAGNOSIS — R079 Chest pain, unspecified: Secondary | ICD-10-CM | POA: Diagnosis not present

## 2013-09-20 LAB — CBC WITH DIFFERENTIAL/PLATELET
BASOS ABS: 0 10*3/uL (ref 0.0–0.1)
Basophils Relative: 0.6 % (ref 0.0–3.0)
Eosinophils Absolute: 0.2 10*3/uL (ref 0.0–0.7)
Eosinophils Relative: 2.7 % (ref 0.0–5.0)
HEMATOCRIT: 42.2 % (ref 36.0–46.0)
Hemoglobin: 13.9 g/dL (ref 12.0–15.0)
LYMPHS ABS: 2.9 10*3/uL (ref 0.7–4.0)
Lymphocytes Relative: 43.6 % (ref 12.0–46.0)
MCHC: 33.1 g/dL (ref 30.0–36.0)
MCV: 91 fl (ref 78.0–100.0)
MONO ABS: 0.5 10*3/uL (ref 0.1–1.0)
Monocytes Relative: 8.2 % (ref 3.0–12.0)
NEUTROS PCT: 44.9 % (ref 43.0–77.0)
Neutro Abs: 3 10*3/uL (ref 1.4–7.7)
Platelets: 241 10*3/uL (ref 150.0–400.0)
RBC: 4.64 Mil/uL (ref 3.87–5.11)
RDW: 13.5 % (ref 11.5–14.6)
WBC: 6.7 10*3/uL (ref 4.5–10.5)

## 2013-09-20 LAB — HEMOGLOBIN A1C: Hgb A1c MFr Bld: 6.9 % — ABNORMAL HIGH (ref 4.6–6.5)

## 2013-09-20 MED ORDER — FLUTICASONE-SALMETEROL 250-50 MCG/DOSE IN AEPB: 1.0000 | INHALATION_SPRAY | Freq: Two times a day (BID) | RESPIRATORY_TRACT | Status: DC

## 2013-09-20 MED ORDER — BENZONATATE 200 MG PO CAPS: 200.0000 mg | ORAL_CAPSULE | Freq: Three times a day (TID) | ORAL | Status: DC | PRN

## 2013-09-20 MED ORDER — DOXYCYCLINE HYCLATE 100 MG PO TABS: 100.0000 mg | ORAL_TABLET | Freq: Two times a day (BID) | ORAL | Status: DC

## 2013-09-20 NOTE — Patient Instructions (Signed)
Plain Mucinex (NOT D) for thick secretions ;force NON dairy fluids .   Nasal cleansing in the shower as discussed with lather of mild shampoo.After 10 seconds wash off lather while  exhaling through nostrils. Make sure that all residual soap is removed to prevent irritation.  Flonase OR Nasacort AQ 1 spray in each nostril twice a day as needed. Use the "crossover" technique into opposite nostril spraying toward opposite ear @ 45 degree angle, not straight up into nostril.  Use a Neti pot daily only  as needed for significant sinus congestion; going from open side to congested side . Plain Allegra (NOT D )  160 daily , Loratidine 10 mg , OR Zyrtec 10 mg @ bedtime  as needed for itchy eyes & sneezing. Your next office appointment will be determined based upon review of your pending labs & x-rays. Those instructions will be transmitted to you   by mail. Followup as needed for your acute issue. Please report any significant change in your symptoms.

## 2013-09-20 NOTE — Progress Notes (Signed)
Pre visit review using our clinic review tool, if applicable. No additional management support is needed unless otherwise documented below in the visit note. 

## 2013-09-20 NOTE — Progress Notes (Signed)
   Subjective:    Patient ID: Elaine Oconnell, female    DOB: August 29, 1940, 73 y.o.   MRN: [id number]  HPI   Her symptoms began 09/12/13 as chills and fever. She did not have a thermometer to measure temperature. Over the next 4 days she had progressive cough associated with malaise. The cough induced discomfort in her abdomen and back. The cough is productive of white, thick sputum.  Dr. Ernesto Rutherford, ENT prescribed a Z-Pak on 3/12.  She's had some associated rhinitis,sneezing and wheezing.  She's never smoked. She has no history of asthma.  She is on an ACE inhibitor    Review of Systems  She denies extrinsic symptoms of itchy, watery eyes. She is not having pleuritic chest pain. She's had no nasal purulence or sinus pain.  She denies any significant reflux symptoms at this time.   FBS not checked. A1c 6.8% 05/19/2013.No dietary compliance.      Objective:   Physical Exam  General appearance:obese;in no acute distress or increased work of breathing is present.  No  lymphadenopathy about the head, neck, or axilla noted.   Eyes: No conjunctival inflammation or lid edema is present. There is no scleral icterus.  Ears:  External ear exam shows no significant lesions or deformities.  Otoscopic examination reveals clear canals, tympanic membranes are intact bilaterally without bulging, retraction, inflammation or discharge.  Nose:  External nasal examination shows no deformity or inflammation. Nasal mucosa are pink and moist without lesions or exudates. No septal dislocation or deviation.No obstruction to airflow.   Oral exam: Dental hygiene is good; lips and gums are healthy appearing.There is no oropharyngeal erythema or exudate noted. Hoarse  Neck:  No deformities,  masses, or tenderness noted.   Heart:  Normal rate and regular rhythm. S1 and S2 normal without gallop, murmur, click, rub or other extra sounds.   Lungs:Chest clear to auscultation; diffuse low grade wheezes, rhonchi,rales  w/o rubs .No increased work of breathing.    Extremities:  No cyanosis or edema.Slight clubbing  Noted.DIP OA changes    Skin: Warm & dry w/o jaundice or tenting.         Assessment & Plan:  #1 acute bronchitis with bronchospasm #2 rhinitis #3 DM ? status Plan: See orders and recommendations

## 2013-09-21 ENCOUNTER — Encounter: Payer: Self-pay | Admitting: *Deleted

## 2013-09-27 ENCOUNTER — Encounter: Payer: Self-pay | Admitting: *Deleted

## 2013-10-07 DIAGNOSIS — J40 Bronchitis, not specified as acute or chronic: Secondary | ICD-10-CM | POA: Diagnosis not present

## 2013-12-08 ENCOUNTER — Ambulatory Visit (INDEPENDENT_AMBULATORY_CARE_PROVIDER_SITE_OTHER): Payer: Medicare Other | Admitting: Family

## 2013-12-08 ENCOUNTER — Encounter: Payer: Self-pay | Admitting: Family

## 2013-12-08 VITALS — BP 132/84 | HR 101 | Temp 98.3°F | Ht 63.0 in | Wt 202.0 lb

## 2013-12-08 DIAGNOSIS — B354 Tinea corporis: Secondary | ICD-10-CM | POA: Diagnosis not present

## 2013-12-08 DIAGNOSIS — E7800 Pure hypercholesterolemia, unspecified: Secondary | ICD-10-CM

## 2013-12-08 DIAGNOSIS — W57XXXA Bitten or stung by nonvenomous insect and other nonvenomous arthropods, initial encounter: Secondary | ICD-10-CM

## 2013-12-08 DIAGNOSIS — T148 Other injury of unspecified body region: Secondary | ICD-10-CM | POA: Diagnosis not present

## 2013-12-08 LAB — CBC WITH DIFFERENTIAL/PLATELET
BASOS ABS: 0 10*3/uL (ref 0.0–0.1)
Basophils Relative: 0.4 % (ref 0.0–3.0)
EOS PCT: 2.6 % (ref 0.0–5.0)
Eosinophils Absolute: 0.2 10*3/uL (ref 0.0–0.7)
HCT: 39.1 % (ref 36.0–46.0)
Hemoglobin: 12.9 g/dL (ref 12.0–15.0)
LYMPHS PCT: 33.2 % (ref 12.0–46.0)
Lymphs Abs: 2 10*3/uL (ref 0.7–4.0)
MCHC: 33.1 g/dL (ref 30.0–36.0)
MCV: 92 fl (ref 78.0–100.0)
MONOS PCT: 9.1 % (ref 3.0–12.0)
Monocytes Absolute: 0.5 10*3/uL (ref 0.1–1.0)
Neutro Abs: 3.3 10*3/uL (ref 1.4–7.7)
Neutrophils Relative %: 54.7 % (ref 43.0–77.0)
PLATELETS: 251 10*3/uL (ref 150.0–400.0)
RBC: 4.25 Mil/uL (ref 3.87–5.11)
RDW: 13.9 % (ref 11.5–15.5)
WBC: 6 10*3/uL (ref 4.0–10.5)

## 2013-12-08 MED ORDER — CLOTRIMAZOLE-BETAMETHASONE 1-0.05 % EX CREA: 1.0000 "application " | TOPICAL_CREAM | Freq: Two times a day (BID) | CUTANEOUS | Status: DC

## 2013-12-08 NOTE — Patient Instructions (Addendum)
Yeast Infection of the Skin Some yeast on the skin is normal, but sometimes it causes an infection. If you have a yeast infection, it shows up as white or light brown patches on brown skin. You can see it better in the summer on tan skin. It causes light-colored holes in your suntan. It can happen on any area of the body. This cannot be passed from person to person. HOME CARE  Scrub your skin daily with a dandruff shampoo. Your rash may take a couple weeks to get well.  Do not scratch or itch the rash. GET HELP RIGHT AWAY IF:   You get another infection from scratching. The skin may get warm, red, and may ooze fluid.  The infection does not seem to be getting better. MAKE SURE YOU:  Understand these instructions.  Will watch your condition.  Will get help right away if you are not doing well or get worse. Document Released: 06/06/2008 Document Revised: 09/16/2011 Document Reviewed: 06/06/2008 Exit[employer] Patient Information 2014 [employer].  

## 2013-12-08 NOTE — Progress Notes (Signed)
Pre visit review using our clinic review tool, if applicable. No additional management support is needed unless otherwise documented below in the visit note. 

## 2013-12-08 NOTE — Progress Notes (Signed)
Subjective:    Patient ID: Elaine Oconnell, female    DOB: [birth year], 73 y.o.   MRN: [id number]  HPI  73 year old white female, nonsmoker, obese, is in today with c/o a itchy, rash to the pubic area x2 days. She believes she may have gotten bit by an insect but is unsure. Denies any pain. No drainage or discharge. Has been applying steroid cream that helped some.  Review of Systems  Constitutional: Negative.  Negative for fever, chills and fatigue.  HENT: Negative.   Respiratory: Negative.   Cardiovascular: Negative.   Gastrointestinal: Negative.   Endocrine: Negative.   Genitourinary: Negative.   Musculoskeletal: Negative.   Skin: Positive for rash.  Allergic/Immunologic: Negative.   Neurological: Negative.   Psychiatric/Behavioral: Negative.    Past Medical History  Diagnosis Date  . Personal history of sarcoidosis   . Hypertension   . Hypercholesterolemia   . Diverticulosis   . Obesity   . GERD (gastroesophageal reflux disease)     with HH  . Type II or unspecified type diabetes mellitus without mention of complication, uncontrolled   . PVC (premature ventricular contraction)   . Depression     History   Social History  . Marital Status: Widowed    Spouse Name: N/A    Number of Children: 3  . Years of Education: N/A   Occupational History  . retired    Social History Main Topics  . Smoking status: Never Smoker   . Smokeless tobacco: Never Used  . Alcohol Use: No  . Drug Use: No  . Sexual Activity: Not on file   Other Topics Concern  . Not on file   Social History Narrative  . No narrative on file    Past Surgical History  Procedure Laterality Date  . Abdominal hysterectomy    . Cholecystectomy    . Appendectomy    . Septal deviation    . Turbinate hypertrophy    . Knee surgery  02/2011    left    Family History  Problem Relation Age of Onset  . Hypertension Mother   . Heart disease Father   . Hyperlipidemia Father   . Heart disease  Maternal Grandmother   . Heart disease Paternal Grandmother   . Heart disease Paternal Grandfather   . Diabetes Maternal Grandfather     Allergies  Allergen Reactions  . Codeine     REACTION: nausea  . Lipitor [Atorvastatin Calcium]   . Naprosyn [Naproxen]   . Sertraline Hcl     Current Outpatient Prescriptions on File Prior to Visit  Medication Sig Dispense Refill  . aspirin 325 MG tablet Take 325 mg by mouth as needed.       . Fluticasone-Salmeterol (ADVAIR DISKUS) 250-50 MCG/DOSE AEPB Inhale 1 puff into the lungs 2 (two) times daily.  14 each  0  . lisinopril (PRINIVIL,ZESTRIL) 10 MG tablet Take 1 tablet (10 mg total) by mouth daily.  90 tablet  3  . metFORMIN (GLUCOPHAGE) 1000 MG tablet TAKE ONE TABLET BY MOUTH EVERY DAY  90 tablet  3   No current facility-administered medications on file prior to visit.    BP 132/84  Pulse 101  Temp(Src) 98.3 F (36.8 C) (Oral)  Ht 5\' 3"  (1.6 m)  Wt 202 lb (91.627 kg)  BMI 35.79 kg/m2chart    Objective:   Physical Exam  Constitutional: She is oriented to person, place, and time. She appears well-developed and well-nourished.  HENT:  Right  Ear: External ear normal.  Left Ear: External ear normal.  Nose: Nose normal.  Mouth/Throat: Oropharynx is clear and moist.  Neck: Normal range of motion. Neck supple.  Cardiovascular: Normal rate, regular rhythm and normal heart sounds.   Pulmonary/Chest: Effort normal and breath sounds normal.  Abdominal: Soft. Bowel sounds are normal.  Musculoskeletal: Normal range of motion.  Neurological: She is alert and oriented to person, place, and time.  Skin: Skin is warm and dry. Rash noted. There is erythema.     Psychiatric: She has a normal mood and affect.          Assessment & Plan:  Elaine Oconnell was seen today for tick bite.  Diagnoses and associated orders for this visit:  Insect bite - CBC with Differential  Tinea corporis  Other Orders - clotrimazole-betamethasone (LOTRISONE)  cream; Apply 1 application topically 2 (two) times daily.   Call the office with any questions or concerns. Recheck as scheduled and as needed.

## 2013-12-23 ENCOUNTER — Encounter: Payer: Self-pay | Admitting: Internal Medicine

## 2013-12-23 ENCOUNTER — Ambulatory Visit (INDEPENDENT_AMBULATORY_CARE_PROVIDER_SITE_OTHER): Payer: Medicare Other | Admitting: Internal Medicine

## 2013-12-23 ENCOUNTER — Other Ambulatory Visit (INDEPENDENT_AMBULATORY_CARE_PROVIDER_SITE_OTHER): Payer: Medicare Other

## 2013-12-23 VITALS — BP 130/92 | HR 78 | Temp 98.6°F | Wt 204.4 lb

## 2013-12-23 DIAGNOSIS — R5383 Other fatigue: Secondary | ICD-10-CM | POA: Diagnosis not present

## 2013-12-23 DIAGNOSIS — E7800 Pure hypercholesterolemia, unspecified: Secondary | ICD-10-CM

## 2013-12-23 DIAGNOSIS — R5381 Other malaise: Secondary | ICD-10-CM | POA: Diagnosis not present

## 2013-12-23 DIAGNOSIS — F32A Depression, unspecified: Secondary | ICD-10-CM

## 2013-12-23 DIAGNOSIS — S30860A Insect bite (nonvenomous) of lower back and pelvis, initial encounter: Secondary | ICD-10-CM

## 2013-12-23 DIAGNOSIS — E1165 Type 2 diabetes mellitus with hyperglycemia: Secondary | ICD-10-CM

## 2013-12-23 DIAGNOSIS — F3289 Other specified depressive episodes: Secondary | ICD-10-CM | POA: Diagnosis not present

## 2013-12-23 DIAGNOSIS — IMO0001 Reserved for inherently not codable concepts without codable children: Secondary | ICD-10-CM | POA: Diagnosis not present

## 2013-12-23 DIAGNOSIS — F329 Major depressive disorder, single episode, unspecified: Secondary | ICD-10-CM

## 2013-12-23 DIAGNOSIS — W57XXXA Bitten or stung by nonvenomous insect and other nonvenomous arthropods, initial encounter: Principal | ICD-10-CM

## 2013-12-23 LAB — HEPATIC FUNCTION PANEL
ALK PHOS: 79 U/L (ref 39–117)
ALT: 16 U/L (ref 0–35)
AST: 21 U/L (ref 0–37)
Albumin: 4 g/dL (ref 3.5–5.2)
BILIRUBIN DIRECT: 0.1 mg/dL (ref 0.0–0.3)
TOTAL PROTEIN: 7.3 g/dL (ref 6.0–8.3)
Total Bilirubin: 0.5 mg/dL (ref 0.2–1.2)

## 2013-12-23 LAB — CBC WITH DIFFERENTIAL/PLATELET
Basophils Absolute: 0.1 10*3/uL (ref 0.0–0.1)
Basophils Relative: 0.7 % (ref 0.0–3.0)
EOS PCT: 1.9 % (ref 0.0–5.0)
Eosinophils Absolute: 0.1 10*3/uL (ref 0.0–0.7)
HCT: 42.1 % (ref 36.0–46.0)
HEMOGLOBIN: 13.9 g/dL (ref 12.0–15.0)
Lymphocytes Relative: 30.9 % (ref 12.0–46.0)
Lymphs Abs: 2.3 10*3/uL (ref 0.7–4.0)
MCHC: 32.9 g/dL (ref 30.0–36.0)
MCV: 92.5 fl (ref 78.0–100.0)
Monocytes Absolute: 0.6 10*3/uL (ref 0.1–1.0)
Monocytes Relative: 7.9 % (ref 3.0–12.0)
NEUTROS PCT: 58.6 % (ref 43.0–77.0)
Neutro Abs: 4.4 10*3/uL (ref 1.4–7.7)
Platelets: 247 10*3/uL (ref 150.0–400.0)
RBC: 4.55 Mil/uL (ref 3.87–5.11)
RDW: 13.4 % (ref 11.5–15.5)
WBC: 7.5 10*3/uL (ref 4.0–10.5)

## 2013-12-23 LAB — BASIC METABOLIC PANEL
BUN: 21 mg/dL (ref 6–23)
CHLORIDE: 102 meq/L (ref 96–112)
CO2: 32 mEq/L (ref 19–32)
CREATININE: 0.9 mg/dL (ref 0.4–1.2)
Calcium: 9.4 mg/dL (ref 8.4–10.5)
GFR: 64.03 mL/min (ref >60.00)
Glucose, Bld: 144 mg/dL — ABNORMAL HIGH (ref 70–99)
Potassium: 4.4 mEq/L (ref 3.5–5.1)
SODIUM: 138 meq/L (ref 135–145)

## 2013-12-23 LAB — LIPID PANEL
CHOL/HDL RATIO: 4
Cholesterol: 206 mg/dL — ABNORMAL HIGH (ref 0–200)
HDL: 53.4 mg/dL (ref >39.00)
LDL Cholesterol: 114 mg/dL — ABNORMAL HIGH (ref 0–99)
NONHDL: 152.6
Triglycerides: 194 mg/dL — ABNORMAL HIGH (ref 0.0–149.0)
VLDL: 38.8 mg/dL (ref 0.0–40.0)

## 2013-12-23 LAB — TSH: TSH: 1.26 u[IU]/mL (ref 0.35–4.50)

## 2013-12-23 LAB — MICROALBUMIN / CREATININE URINE RATIO
CREATININE, U: 157.7 mg/dL
MICROALB UR: 0.4 mg/dL (ref 0.0–1.9)
Microalb Creat Ratio: 0.3 mg/g (ref 0.0–30.0)

## 2013-12-23 MED ORDER — DOXYCYCLINE HYCLATE 100 MG PO TABS: 100.0000 mg | ORAL_TABLET | Freq: Two times a day (BID) | ORAL | Status: DC

## 2013-12-23 MED ORDER — HYDROXYZINE HCL 10 MG PO TABS: 10.0000 mg | ORAL_TABLET | Freq: Three times a day (TID) | ORAL | Status: DC | PRN

## 2013-12-23 NOTE — Progress Notes (Signed)
Subjective:    Patient ID: Elaine Oconnell, female    DOB: 12-Sep-1940, 73 y.o.   MRN: [id number]  HPI  Patient is here for follow up  Reviewed chronic medical issues and interval medical events  Past Medical History  Diagnosis Date  . Personal history of sarcoidosis   . Hypertension   . Hypercholesterolemia   . Diverticulosis   . Obesity   . GERD (gastroesophageal reflux disease)     with HH  . Type II or unspecified type diabetes mellitus without mention of complication, uncontrolled   . PVC (premature ventricular contraction)   . Depression     Review of Systems  Constitutional: Positive for fatigue (chronic). Negative for fever and unexpected weight change.  Respiratory: Negative for cough and shortness of breath.   Cardiovascular: Negative for chest pain and leg swelling.  Musculoskeletal: Negative for joint swelling and myalgias.  Skin: Positive for wound (itch at tick bites).       Objective:   Physical Exam  BP 130/92  Pulse 78  Temp(Src) 98.6 F (37 C) (Oral)  Wt 204 lb 6.4 oz (92.715 kg)  SpO2 96% Wt Readings from Last 3 Encounters:  12/23/13 204 lb 6.4 oz (92.715 kg)  12/08/13 202 lb (91.627 kg)  09/20/13 203 lb 12.8 oz (92.443 kg)   Constitutional: She is obese, but appears well-developed and well-nourished. No distress.  Neck: Normal range of motion. Neck supple. No JVD present. No thyromegaly present.  Cardiovascular: Normal rate, regular rhythm and normal heart sounds.  No murmur heard. No BLE edema. Pulmonary/Chest: Effort normal and breath sounds normal. No respiratory distress. She has no wheezes.  Skin: various noninflamed insect bite marks at bilateral buttocks and posterior thighs, no abscess, induration or evidence of persisting tick infestation Psychiatric: She has a mildly dysphoric and occasional tearful mood and affect (spitting up stressors related to her grandson, greater than 49 and mother's medical illness). Her behavior is normal.  Judgment and thought content normal.   Lab Results  Component Value Date   WBC 6.0 12/08/2013   HGB 12.9 12/08/2013   HCT 39.1 12/08/2013   PLT 251.0 12/08/2013   GLUCOSE 127* 05/19/2013   CHOL 189 12/22/2012   TRIG 122.0 12/22/2012   HDL 55.70 12/22/2012   LDLCALC 109* 12/22/2012   ALT 13 12/22/2012   AST 17 12/22/2012   NA 138 05/19/2013   K 4.4 05/19/2013   CL 103 05/19/2013   CREATININE 1.0 05/19/2013   BUN 17 05/19/2013   CO2 27 05/19/2013   HGBA1C 6.9* 09/20/2013    Dg Chest 2 View  09/20/2013   CLINICAL DATA:  Cough and dyspnea and chest pain  EXAM: CHEST  2 VIEW  COMPARISON:  DG RIBS UNILATERAL W/CHEST*L* dated 07/03/2011  FINDINGS: The lungs are borderline hypoinflated. There are coarse lung markings lateral to the cardiac apex which are more conspicuous than in the past. These may lie anteriorly. The cardiac silhouette is normal in size. The pulmonary vascularity is not engorged. There is mild tortuosity of the descending thoracic aorta. The observed portions of the bony thorax exhibit no acute abnormalities. There is mild degenerative disc space narrowing at multiple thoracic levels.  IMPRESSION: There is borderline hypo inflation bilaterally. Subsegmental atelectasis is suspected in the lingula. There is no alveolar pneumonia nor evidence of CHF.   Electronically Signed   By: Silvino Selman  [place]   On: 09/20/2013 14:23       Assessment & Plan:   Tick bites.  No evidence of persisting infestation. Will treat symptomatically with topical cortisone, oral antihistamine and empiric doxycycline -education and reassurance provided  Fatigue - nonspecific symptoms/exam - check screening labs  Problem List Items Addressed This Visit   Depression     Situational exacerbation due to family and financial stressors Trial SSRI Celexa summer of 2014 poorly tolerated because of side effects Pt declines need for refer to behavioral health for counseling support Verified no SI/HI Patient will call  symptoms worse or unimproved to reconsider alternate therapy if needed    Relevant Medications      hydrOXYzine (ATARAX/VISTARIL) tablet   Hypercholesterolemia     Refuses statin because of adverse reaction to Lipitor in the past Check annually, Encourage diet and weight control for management of same    Relevant Orders      Lipid panel   Type II or unspecified type diabetes mellitus without mention of complication, uncontrolled      On metformin for same Infrequent checking CBGs, reports under 120 Check a1c q3-6 mo The patient is asked to make an attempt to improve diet and exercise patterns to aid in medical management of this problem. Lab Results  Component Value Date   HGBA1C 6.9* 09/20/2013      Relevant Orders      Lipid panel      Microalbumin / creatinine urine ratio    Other Visit Diagnoses   Tick bite of buttock    -  Primary    Fatigue        Relevant Orders       Basic metabolic panel       CBC with Differential       Hepatic function panel       Lipid panel       TSH

## 2013-12-23 NOTE — Assessment & Plan Note (Signed)
Refuses statin because of adverse reaction to Lipitor in the past Check annually, Encourage diet and weight control for management of same

## 2013-12-23 NOTE — Assessment & Plan Note (Signed)
On metformin for same Infrequent checking CBGs, reports under 120 Check a1c q3-6 mo The patient is asked to make an attempt to improve diet and exercise patterns to aid in medical management of this problem. Lab Results  Component Value Date   HGBA1C 6.9* 09/20/2013

## 2013-12-23 NOTE — Assessment & Plan Note (Signed)
Situational exacerbation due to family and financial stressors Trial SSRI Celexa summer of 2014 poorly tolerated because of side effects Pt declines need for refer to behavioral health for counseling support Verified no SI/HI Patient will call symptoms worse or unimproved to reconsider alternate therapy if needed

## 2013-12-23 NOTE — Patient Instructions (Signed)
It was good to see you today.  We have reviewed your prior records including labs and tests today  Test(s) ordered today. Your results will be released to [place] (or called to you) after review, usually within 72hours after test completion. If any changes need to be made, you will be notified at that same time.  Medications reviewed and updated  Doxycycline antibiotics twice daily for 10 days as discussed generic Atarax 3 times daily as needed for itch (or use Benadryl)  Your prescription(s) have been submitted to your pharmacy. Please take as directed and contact our office if you believe you are having problem(s) with the medication(s).  No other changes recommended at this time.  Please schedule followup in 6 months, call sooner if problems.

## 2013-12-23 NOTE — Progress Notes (Signed)
Pre visit review using our clinic review tool, if applicable. No additional management support is needed unless otherwise documented below in the visit note. 

## 2014-01-12 ENCOUNTER — Telehealth: Payer: Self-pay | Admitting: *Deleted

## 2014-01-12 DIAGNOSIS — IMO0001 Reserved for inherently not codable concepts without codable children: Secondary | ICD-10-CM

## 2014-01-12 DIAGNOSIS — E1165 Type 2 diabetes mellitus with hyperglycemia: Principal | ICD-10-CM

## 2014-01-12 NOTE — Telephone Encounter (Signed)
Patient has an appointment A1c, bmet ordered Diabetic bundle 

## 2014-02-11 ENCOUNTER — Encounter: Payer: Self-pay | Admitting: Nurse Practitioner

## 2014-02-11 ENCOUNTER — Ambulatory Visit (INDEPENDENT_AMBULATORY_CARE_PROVIDER_SITE_OTHER): Payer: Medicare Other | Admitting: Nurse Practitioner

## 2014-02-11 VITALS — BP 134/76 | HR 89 | Temp 97.5°F | Ht 63.0 in | Wt 207.0 lb

## 2014-02-11 DIAGNOSIS — E119 Type 2 diabetes mellitus without complications: Secondary | ICD-10-CM | POA: Diagnosis not present

## 2014-02-11 DIAGNOSIS — F329 Major depressive disorder, single episode, unspecified: Secondary | ICD-10-CM

## 2014-02-11 DIAGNOSIS — F3289 Other specified depressive episodes: Secondary | ICD-10-CM | POA: Diagnosis not present

## 2014-02-11 DIAGNOSIS — IMO0001 Reserved for inherently not codable concepts without codable children: Secondary | ICD-10-CM

## 2014-02-11 DIAGNOSIS — E1165 Type 2 diabetes mellitus with hyperglycemia: Secondary | ICD-10-CM

## 2014-02-11 DIAGNOSIS — Z79899 Other long term (current) drug therapy: Secondary | ICD-10-CM | POA: Diagnosis not present

## 2014-02-11 DIAGNOSIS — F32A Depression, unspecified: Secondary | ICD-10-CM

## 2014-02-11 LAB — VITAMIN B12: Vitamin B-12: 581 pg/mL (ref 211–911)

## 2014-02-11 MED ORDER — FLUOXETINE HCL 20 MG PO TABS: 20.0000 mg | ORAL_TABLET | Freq: Every day | ORAL | Status: DC

## 2014-02-11 MED ORDER — FLUOXETINE HCL 20 MG PO TABS: ORAL_TABLET | ORAL | Status: DC

## 2014-02-11 NOTE — Progress Notes (Signed)
Pre visit review using our clinic review tool, if applicable. No additional management support is needed unless otherwise documented below in the visit note. 

## 2014-02-11 NOTE — Patient Instructions (Addendum)
Cut out refined sugar -nothing sweet to eat or drink except fresh fruit.  Cut out refined flour: no muffins, bagels, biscuits, white breads. Cereals & breads should have 4 grams fiber per serving. Eat brown rice instead of white rice. Eat fruits & vegetables with every meal.  Go to nutrition class. Make appointment with behavioral health.  Start prozac daily at bedtime.  I will see you in 4 weeks.  Nice to meet you!

## 2014-02-14 NOTE — Assessment & Plan Note (Signed)
Start fluoxetine. Ref CBT.

## 2014-02-14 NOTE — Progress Notes (Signed)
Subjective:     Elaine Oconnell is a 73 y.o. female presents for follow up of depression & DM. She is new to me, but is an established patient with [facility]. Ms. Thobe is very talkative and expresses distress over multiple family stressors as well as becomes tearful over husband's death 30 years ago. She expresses desire to lose weight and get better control over DM.  The following portions of the patient's history were reviewed and updated as appropriate: allergies, current medications, past family history, past medical history, past social history, past surgical history and problem list.  Review of Systems Pertinent items are noted in HPI.    Objective:    BP 134/76  Pulse 89  Temp(Src) 97.5 F (36.4 C) (Temporal)  Ht 5\' 3"  (1.6 m)  Wt 207 lb (93.895 kg)  BMI 36.68 kg/m2  SpO2 96% BP 134/76  Pulse 89  Temp(Src) 97.5 F (36.4 C) (Temporal)  Ht 5\' 3"  (1.6 m)  Wt 207 lb (93.895 kg)  BMI 36.68 kg/m2  SpO2 96% General appearance: alert, cooperative, appears stated age and mild distress Head: Normocephalic, without obvious abnormality, atraumatic Eyes: negative findings: lids and lashes normal, conjunctivae and sclerae normal, corneas clear and pupils equal, round, reactive to light and accomodation, wears glasses Lungs: clear to auscultation bilaterally Heart: regular rate and rhythm, S1, S2 normal, no murmur, click, rub or gallop Extremities: extremities normal, atraumatic, no cyanosis or edema    Assessment:   1. Depressive disorder, not elsewhere classified - Vitamin B12 - FLUoxetine (PROZAC) 20 MG tablet; Take 1/2 t po qhs X 7d, then 1 T po qhs.  Dispense: 30 tablet; Refill: 1  2. Type 2 diabetes mellitus without complication - Amb ref to Medical Nutrition Therapy-MNT  See problem list for complete A&P See pt instructions.  F/u 4 weeks.

## 2014-02-14 NOTE — Assessment & Plan Note (Signed)
Continue metformin1000mg  qd. Diet changes: cut refined sugar & flour. Ref to MNT. F/u 1 mo.

## 2014-02-16 ENCOUNTER — Telehealth: Payer: Self-pay | Admitting: Nurse Practitioner

## 2014-02-16 NOTE — Telephone Encounter (Signed)
Left detailed message on pt's vm. Per DPR 

## 2014-02-16 NOTE — Telephone Encounter (Signed)
pls call pt: Advise B12 nml.

## 2014-03-02 ENCOUNTER — Ambulatory Visit: Payer: Medicare Other | Admitting: Psychology

## 2014-03-18 DIAGNOSIS — Z23 Encounter for immunization: Secondary | ICD-10-CM | POA: Diagnosis not present

## 2014-03-31 ENCOUNTER — Ambulatory Visit: Payer: Medicare Other | Admitting: *Deleted

## 2014-04-01 ENCOUNTER — Ambulatory Visit: Payer: Medicare Other | Admitting: Nurse Practitioner

## 2014-04-18 ENCOUNTER — Ambulatory Visit (INDEPENDENT_AMBULATORY_CARE_PROVIDER_SITE_OTHER): Payer: Medicare Other | Admitting: Nurse Practitioner

## 2014-04-18 ENCOUNTER — Encounter: Payer: Self-pay | Admitting: Nurse Practitioner

## 2014-04-18 VITALS — BP 120/84 | HR 93 | Temp 98.6°F | Ht 63.0 in | Wt 205.0 lb

## 2014-04-18 DIAGNOSIS — N3001 Acute cystitis with hematuria: Secondary | ICD-10-CM | POA: Diagnosis not present

## 2014-04-18 DIAGNOSIS — R300 Dysuria: Secondary | ICD-10-CM | POA: Diagnosis not present

## 2014-04-18 LAB — POCT URINALYSIS DIPSTICK
BILIRUBIN UA: NEGATIVE
Glucose, UA: NEGATIVE
KETONES UA: NEGATIVE
Nitrite, UA: POSITIVE
Spec Grav, UA: 1.02
Urobilinogen, UA: 0.2
pH, UA: 5.5

## 2014-04-18 MED ORDER — PHENAZOPYRIDINE HCL 200 MG PO TABS: 200.0000 mg | ORAL_TABLET | Freq: Three times a day (TID) | ORAL | Status: DC | PRN

## 2014-04-18 MED ORDER — CIPROFLOXACIN HCL 500 MG PO TABS: 500.0000 mg | ORAL_TABLET | Freq: Two times a day (BID) | ORAL | Status: DC

## 2014-04-18 NOTE — Progress Notes (Signed)
Subjective:    Elaine Oconnell is a 73 y.o. female who complains of dysuria and frequency for 6 days.  Patient also complains of L-sided back pain-but states this is chronic. Patient denies fever and nausea, abdominal pain.  Patient does not have a history of recurrent UTI.  Pt has diabetes controlled by metformin. BS in office is 141 today.  The following portions of the patient's history were reviewed and updated as appropriate: allergies, current medications, past medical history, past social history, past surgical history and problem list. Review of Systems Pertinent items are noted in HPI.    Objective:    BP 120/84  Pulse 93  Temp(Src) 98.6 F (37 C) (Oral)  Ht 5\' 3"  (1.6 m)  Wt 205 lb (92.987 kg)  BMI 36.32 kg/m2  SpO2 94% General: alert, cooperative, appears stated age and no distress  Abdomen: soft, non-tender, without masses or organomegaly and obese   Back: CVA tenderness absent  GU: defer exam   Laboratory:  Urine dipstick shows small blood, trace protein, pos nitrates, lg leuks. Urine culture pending  Assessment:  1. Dysuria - Urine culture - POCT urinalysis dipstick - phenazopyridine (PYRIDIUM) 200 MG tablet; Take 1 tablet (200 mg total) by mouth 3 (three) times daily as needed for pain.  Dispense: 6 tablet; Refill: 0  2. Acute cystitis with hematuria - ciprofloxacin (CIPRO) 500 MG tablet; Take 1 tablet (500 mg total) by mouth 2 (two) times daily.  Dispense: 6 tablet; Refill: 0  F/u 1 month

## 2014-04-18 NOTE — Progress Notes (Signed)
Pre visit review using our clinic review tool, if applicable. No additional management support is needed unless otherwise documented below in the visit note. 

## 2014-04-18 NOTE — Patient Instructions (Signed)
Start antibiotic. Our office will call if we need to change the antibiotic. Take pyridium to relax bladder, caution: urine tears & sweat will be orange. Do not be alarmed! Sip hydrating fluids (water, juice, colorless soda, decaff tea) every hour to flush kidneys. Return in 1 month to recheck urine or sooner if symptoms do not improve or you feel worse.  Urinary Tract Infection Urinary tract infections (UTIs) can develop anywhere along your urinary tract. Your urinary tract is your body's drainage system for removing wastes and extra water. Your urinary tract includes two kidneys, two ureters, a bladder, and a urethra. Your kidneys are a pair of bean-shaped organs. Each kidney is about the size of your fist. They are located below your ribs, one on each side of your spine. CAUSES Infections are caused by microbes, which are microscopic organisms, including fungi, viruses, and bacteria. These organisms are so small that they can only be seen through a microscope. Bacteria are the microbes that most commonly cause UTIs. SYMPTOMS  Symptoms of UTIs may vary by age and gender of the patient and by the location of the infection. Symptoms in young women typically include a frequent and intense urge to urinate and a painful, burning feeling in the bladder or urethra during urination. Older women and men are more likely to be tired, shaky, and weak and have muscle aches and abdominal pain. A fever may mean the infection is in your kidneys. Other symptoms of a kidney infection include pain in your back or sides below the ribs, nausea, and vomiting. DIAGNOSIS To diagnose a UTI, your caregiver will ask you about your symptoms. Your caregiver also will ask to provide a urine sample. The urine sample will be tested for bacteria and white blood cells. White blood cells are made by your body to help fight infection. TREATMENT  Typically, UTIs can be treated with medication. Because most UTIs are caused by a bacterial  infection, they usually can be treated with the use of antibiotics. The choice of antibiotic and length of treatment depend on your symptoms and the type of bacteria causing your infection. HOME CARE INSTRUCTIONS  If you were prescribed antibiotics, take them exactly as your caregiver instructs you. Finish the medication even if you feel better after you have only taken some of the medication.  Drink enough water and fluids to keep your urine clear or pale yellow.  Avoid caffeine, tea, and carbonated beverages. They tend to irritate your bladder.  Empty your bladder often. Avoid holding urine for long periods of time.  Empty your bladder before and after sexual intercourse.  After a bowel movement, women should cleanse from front to back. Use each tissue only once. SEEK MEDICAL CARE IF:   You have back pain.  You develop a fever.  Your symptoms do not begin to resolve within 3 days. SEEK IMMEDIATE MEDICAL CARE IF:   You have severe back pain or lower abdominal pain.  You develop chills.  You have nausea or vomiting.  You have continued burning or discomfort with urination. MAKE SURE YOU:   Understand these instructions.  Will watch your condition.  Will get help right away if you are not doing well or get worse. Document Released: 04/03/2005 Document Revised: 12/24/2011 Document Reviewed: 08/02/2011 Exit[employer] Patient Information 2014 [employer].  

## 2014-04-21 ENCOUNTER — Telehealth: Payer: Self-pay | Admitting: Nurse Practitioner

## 2014-04-21 LAB — URINE CULTURE: Colony Count: >=100000

## 2014-04-21 NOTE — Telephone Encounter (Signed)
pls call pt: Ask if feeling better.

## 2014-04-22 NOTE — Telephone Encounter (Signed)
Patient stated that she feels better then she felt at [facility], however she is still not all the way better.

## 2014-04-26 DIAGNOSIS — Z1231 Encounter for screening mammogram for malignant neoplasm of breast: Secondary | ICD-10-CM | POA: Diagnosis not present

## 2014-05-06 ENCOUNTER — Ambulatory Visit: Payer: Medicare Other | Admitting: Nurse Practitioner

## 2014-05-09 ENCOUNTER — Encounter: Payer: Self-pay | Admitting: Nurse Practitioner

## 2014-05-09 ENCOUNTER — Telehealth: Payer: Self-pay | Admitting: Nurse Practitioner

## 2014-05-09 ENCOUNTER — Other Ambulatory Visit: Payer: Self-pay | Admitting: Nurse Practitioner

## 2014-05-09 DIAGNOSIS — N3001 Acute cystitis with hematuria: Secondary | ICD-10-CM

## 2014-05-09 DIAGNOSIS — Z1239 Encounter for other screening for malignant neoplasm of breast: Secondary | ICD-10-CM | POA: Insufficient documentation

## 2014-05-09 MED ORDER — CIPROFLOXACIN HCL 250 MG PO TABS: 250.0000 mg | ORAL_TABLET | Freq: Two times a day (BID) | ORAL | Status: DC

## 2014-05-09 NOTE — Telephone Encounter (Signed)
Spoke to pt who stated that she is still having symptoms and would like a refill on cipro. Please advise? If possible to refill, pt would like rx to go to [facility].

## 2014-05-09 NOTE — Telephone Encounter (Signed)
Script sent. Please schedule follow up visit in 3 weeks.

## 2014-05-09 NOTE — Telephone Encounter (Signed)
Pt called and said her bladder infection has returned and she would like a refill on her meds. Please send refills to the [facility] [address]

## 2014-05-13 NOTE — Telephone Encounter (Signed)
Pt called stating she is having right shoulder pain/spasms. She is requesting muscle relaxers and pain medication. Please advise

## 2014-05-16 ENCOUNTER — Encounter: Payer: Self-pay | Admitting: Nurse Practitioner

## 2014-05-17 ENCOUNTER — Ambulatory Visit (INDEPENDENT_AMBULATORY_CARE_PROVIDER_SITE_OTHER): Payer: Medicare Other | Admitting: Nurse Practitioner

## 2014-05-17 ENCOUNTER — Encounter: Payer: Self-pay | Admitting: Nurse Practitioner

## 2014-05-17 VITALS — BP 120/80 | HR 91 | Temp 97.6°F | Ht 63.0 in | Wt 207.0 lb

## 2014-05-17 DIAGNOSIS — N3001 Acute cystitis with hematuria: Secondary | ICD-10-CM | POA: Diagnosis not present

## 2014-05-17 DIAGNOSIS — N390 Urinary tract infection, site not specified: Secondary | ICD-10-CM

## 2014-05-17 DIAGNOSIS — S46912A Strain of unspecified muscle, fascia and tendon at shoulder and upper arm level, left arm, initial encounter: Secondary | ICD-10-CM

## 2014-05-17 DIAGNOSIS — F329 Major depressive disorder, single episode, unspecified: Secondary | ICD-10-CM

## 2014-05-17 DIAGNOSIS — S46919A Strain of unspecified muscle, fascia and tendon at shoulder and upper arm level, unspecified arm, initial encounter: Secondary | ICD-10-CM

## 2014-05-17 DIAGNOSIS — E119 Type 2 diabetes mellitus without complications: Secondary | ICD-10-CM | POA: Diagnosis not present

## 2014-05-17 DIAGNOSIS — S46911A Strain of unspecified muscle, fascia and tendon at shoulder and upper arm level, right arm, initial encounter: Secondary | ICD-10-CM

## 2014-05-17 DIAGNOSIS — E1169 Type 2 diabetes mellitus with other specified complication: Secondary | ICD-10-CM

## 2014-05-17 DIAGNOSIS — I10 Essential (primary) hypertension: Secondary | ICD-10-CM

## 2014-05-17 DIAGNOSIS — E669 Obesity, unspecified: Secondary | ICD-10-CM

## 2014-05-17 DIAGNOSIS — E7800 Pure hypercholesterolemia, unspecified: Secondary | ICD-10-CM

## 2014-05-17 DIAGNOSIS — F32A Depression, unspecified: Secondary | ICD-10-CM

## 2014-05-17 LAB — COMPREHENSIVE METABOLIC PANEL
ALBUMIN: 3.1 g/dL — AB (ref 3.5–5.2)
ALT: 11 U/L (ref 0–35)
AST: 14 U/L (ref 0–37)
Alkaline Phosphatase: 90 U/L (ref 39–117)
BUN: 16 mg/dL (ref 6–23)
CALCIUM: 9 mg/dL (ref 8.4–10.5)
CHLORIDE: 102 meq/L (ref 96–112)
CO2: 30 mEq/L (ref 19–32)
Creatinine, Ser: 0.8 mg/dL (ref 0.4–1.2)
GFR: 73.16 mL/min (ref >60.00)
Glucose, Bld: 126 mg/dL — ABNORMAL HIGH (ref 70–99)
POTASSIUM: 4.4 meq/L (ref 3.5–5.1)
Sodium: 141 mEq/L (ref 135–145)
Total Bilirubin: 0.5 mg/dL (ref 0.2–1.2)
Total Protein: 6.8 g/dL (ref 6.0–8.3)

## 2014-05-17 LAB — LIPID PANEL
CHOL/HDL RATIO: 3
Cholesterol: 178 mg/dL (ref 0–200)
HDL: 52.3 mg/dL (ref >39.00)
LDL Cholesterol: 108 mg/dL — ABNORMAL HIGH (ref 0–99)
NONHDL: 125.7
TRIGLYCERIDES: 91 mg/dL (ref 0.0–149.0)
VLDL: 18.2 mg/dL (ref 0.0–40.0)

## 2014-05-17 LAB — HEMOGLOBIN A1C: HEMOGLOBIN A1C: 6.7 % — AB (ref 4.6–6.5)

## 2014-05-17 MED ORDER — METHOCARBAMOL 500 MG PO TABS: 500.0000 mg | ORAL_TABLET | Freq: Every evening | ORAL | Status: DC | PRN

## 2014-05-17 NOTE — Assessment & Plan Note (Signed)
Pt states gets low abd pain about once/mo, self-limiting. Advised to change diet to clear liquids for about 3 days. If pain persist call ofc. No recent pain.

## 2014-05-17 NOTE — Assessment & Plan Note (Signed)
Tolerating meds w/out SE Refuses statin Last Aic 3/15, 6.9 Not making diet changes, not exercising Gave diet plan-must cut out sugar & refined grains CMET , A1c today

## 2014-05-17 NOTE — Assessment & Plan Note (Signed)
Discussed recommendation of statin in people w/DM to prevent heart disease & stroke. Pt understands. Wants to know what numbers are, but not willing to take statin. Lipids today Gave diet plan to cut out refined sugar & grains.

## 2014-05-17 NOTE — Progress Notes (Signed)
Pre visit review using our clinic review tool, if applicable. No additional management support is needed unless otherwise documented below in the visit note. 

## 2014-05-17 NOTE — Patient Instructions (Signed)
We will call if labs need to be addressed right away, otherwise I will discuss at follow up in 1 month.  Start meal plan. You will cut out sugar & refined grains & limit meat & eggs to 3 times weekly.  Meals will consist of fresh fruit, cooked & raw vegetables, beans, nuts, soups, whole grains like brown rice, quinoa, & whole wheat pasta. You will loose 1 to 2 pounds /week. Blood sugar & cholesterol will normalize.  For shoulder pain, use aspercream & heat three times daily. Use muscle relaxer at night. Do gentle arm & shoulder exercises-move in all directions several times daily.  I will see you in 4 weeks for weight check. Enjoy eating nutritious food!

## 2014-05-17 NOTE — Assessment & Plan Note (Signed)
Symptoms resolved after 2 rounds ABX Check urine today

## 2014-05-17 NOTE — Assessment & Plan Note (Signed)
Took fluoxetine for 3 days-did not feel well & potential SE scared her. Feeling better after talked w/pastor.

## 2014-05-17 NOTE — Assessment & Plan Note (Signed)
Well controlled  Tolerating meds w/out SE No microalb RF: obese, sedentary, age, hyperlipidemia, DM Continue current meds CMET today

## 2014-05-17 NOTE — Progress Notes (Signed)
Subjective:     Elaine Oconnell is a 73 y.o. female presents for f/u of DM, depression, recent UTI, and new c/o bilat shoulder & neck pain. Re DM: she has type 2 controlled w/metformin 1000 mg qd. Last A1C 6.9. She is not eating healthy diet, not exercising. Re depression: she is feeling better.; took fluoxetine for few days, didn't like SE & read warnings that may make her feel suicidal, so she stopped. She has been seeing pastor & is feeling better. Re recent UTI: symptoms have resolved after 2 rounds abx. Re shoulder pain: started 4 d after cleaning shower. Ibuprophen does not help. Pain worse w/movement. Hot shower helps.  The following portions of the patient's history were reviewed and updated as appropriate: allergies, current medications, past medical history, past social history, past surgical history and problem list.  Review of Systems Constitutional: negative for weight loss Gastrointestinal: positive for occsioanl pain across lower abdomen. self-limited Genitourinary:negative for dysuria Musculoskeletal:positive for sore muscles in back of neck & across top of shoulder & along scapula Behavioral/Psych: negative for depression Endocrine: negative for diabetic symptoms including polydipsia, polyphagia and polyuria    Objective:    BP 120/80 mmHg  Pulse 91  Temp(Src) 97.6 F (36.4 C) (Temporal)  Ht 5\' 3"  (1.6 m)  Wt 207 lb (93.895 kg)  BMI 36.68 kg/m2  SpO2 96% BP 120/80 mmHg  Pulse 91  Temp(Src) 97.6 F (36.4 C) (Temporal)  Ht 5\' 3"  (1.6 m)  Wt 207 lb (93.895 kg)  BMI 36.68 kg/m2  SpO2 96% General appearance: alert, cooperative and appears stated age Head: Normocephalic, without obvious abnormality, atraumatic Eyes: negative findings: lids and lashes normal, conjunctivae and sclerae normal and wears glasses Neck: no adenopathy, no carotid bruit, supple, symmetrical, trachea midline and thyroid not enlarged, symmetric, no tenderness/mass/nodules Back: tender along  bilat traps Lungs: clear to auscultation bilaterally Heart: regular rate and rhythm, S1, S2 normal, no murmur, click, rub or gallop Extremities: no edema, redness or tenderness in the calves or thighs Pulses: 2+ and symmetric    Assessment:     1. Muscle strain of scapular region, unspecified laterality, initial encounter - methocarbamol (ROBAXIN) 500 MG tablet; Take 1 tablet (500 mg total) by mouth at bedtime as needed for muscle spasms.  Dispense: 20 tablet; Refill: 0 Aspercream, heat, exercises  2. Diabetes mellitus type 2 in obese - Hemoglobin A1c - Lipid panel - Comprehensive metabolic panel Diet modification  3. Recurrent UTI - Urine culture  4. Essential hypertension, benign  5. Acute cystitis with hematuria  6. Depression  7. Hypercholesterolemia  See problem list for complete A&P See pt instructions. F/u 1 mo shoulder & neck pain, weight

## 2014-05-18 ENCOUNTER — Telehealth: Payer: Self-pay | Admitting: Nurse Practitioner

## 2014-05-18 NOTE — Telephone Encounter (Signed)
emmi mailed  °

## 2014-05-18 NOTE — Telephone Encounter (Signed)
pls call pt: Advise Labs have improved. Continue to cut out sugar & refined grains.

## 2014-05-19 DIAGNOSIS — N390 Urinary tract infection, site not specified: Secondary | ICD-10-CM | POA: Diagnosis not present

## 2014-05-19 NOTE — Telephone Encounter (Signed)
Patient notified of results.

## 2014-05-21 LAB — URINE CULTURE

## 2014-05-23 ENCOUNTER — Telehealth: Payer: Self-pay | Admitting: Nurse Practitioner

## 2014-05-23 DIAGNOSIS — R8271 Bacteriuria: Secondary | ICD-10-CM

## 2014-05-23 MED ORDER — AMOXICILLIN-POT CLAVULANATE 875-125 MG PO TABS: 1.0000 | ORAL_TABLET | Freq: Two times a day (BID) | ORAL | Status: DC

## 2014-05-23 NOTE — Telephone Encounter (Signed)
pls call pt: Advise Still has bacteria in urine. I have sent a script for augmentin. She should take at breakfast & bedtime. Start antibiotic. Eat yogurt at lunch daily to help prevent antibiotic -associated diarrhea.  Abx may cause nausea-eat before taking it.  She should keep December appt.

## 2014-05-23 NOTE — Telephone Encounter (Signed)
Spoke with pt, advised message from Layne. Pt understood. 

## 2014-06-14 ENCOUNTER — Encounter: Payer: Self-pay | Admitting: Nurse Practitioner

## 2014-06-14 ENCOUNTER — Ambulatory Visit (INDEPENDENT_AMBULATORY_CARE_PROVIDER_SITE_OTHER): Payer: Medicare Other | Admitting: Nurse Practitioner

## 2014-06-14 VITALS — BP 130/90 | HR 96 | Temp 98.1°F | Resp 18 | Ht 63.0 in | Wt 206.0 lb

## 2014-06-14 DIAGNOSIS — Z87440 Personal history of urinary (tract) infections: Secondary | ICD-10-CM | POA: Diagnosis not present

## 2014-06-14 DIAGNOSIS — R300 Dysuria: Secondary | ICD-10-CM | POA: Diagnosis not present

## 2014-06-14 DIAGNOSIS — E669 Obesity, unspecified: Secondary | ICD-10-CM

## 2014-06-14 DIAGNOSIS — E7800 Pure hypercholesterolemia, unspecified: Secondary | ICD-10-CM

## 2014-06-14 DIAGNOSIS — Z23 Encounter for immunization: Secondary | ICD-10-CM | POA: Diagnosis not present

## 2014-06-14 DIAGNOSIS — E1169 Type 2 diabetes mellitus with other specified complication: Secondary | ICD-10-CM

## 2014-06-14 DIAGNOSIS — E119 Type 2 diabetes mellitus without complications: Secondary | ICD-10-CM | POA: Diagnosis not present

## 2014-06-14 DIAGNOSIS — I10 Essential (primary) hypertension: Secondary | ICD-10-CM | POA: Diagnosis not present

## 2014-06-14 MED ORDER — METFORMIN HCL 1000 MG PO TABS: ORAL_TABLET | ORAL | Status: AC

## 2014-06-14 MED ORDER — LISINOPRIL 10 MG PO TABS: 10.0000 mg | ORAL_TABLET | Freq: Every day | ORAL | Status: AC

## 2014-06-14 NOTE — Assessment & Plan Note (Signed)
Improved A1c: 6.7 from 6.9. Continue w/current meds Needs dilated eye exam Foot exam today-few callouses, cold toes Encourage daily exercise & diet low in refined sugar & fat/high in whole grains, fruits & veggies

## 2014-06-14 NOTE — Patient Instructions (Signed)
Please get opthalmology exam.  Continue blood pressure & diabetes meds. Continue to cut back refined sugar. Eat fruits & vegetables every day. Eat high quality grains-4 grams fiber or more per serving.  Walk every day.  See you in 6 months or sooner if you need me.  Merry Christmas!

## 2014-06-14 NOTE — Assessment & Plan Note (Signed)
Improved numbers w/diet changes. 10 yr risk for ASCVD: 37.3% Pt refuses statin. Encourage exercise & diet low in sugar, fat/high in fruits, veggies, whole grains

## 2014-06-14 NOTE — Assessment & Plan Note (Signed)
Bp not at goal today-thinks had salty foods last night.  Continue current meds Goal 135/85 Enc daily walk, healthy diet. RF: age, sedentary, DM, hypercholesterolemia.

## 2014-06-14 NOTE — Progress Notes (Signed)
Pre visit review using our clinic review tool, if applicable. No additional management support is needed unless otherwise documented below in the visit note. 

## 2014-06-14 NOTE — Progress Notes (Signed)
Subjective:     Elaine Oconnell is a 73 y.o. female presents for f/u of neck & shoulder pain, DM, hypercholesterolemia, HTN, & chronic UTI. Neck & shoulder pain resolved after 3 weeks-had pain after cleaning shower.  DM: A1C improved to 6.7 from 6.9 with diet changes. No intolerable SE to med. Needs eye exam. Performed foot exam today. Cholest: Hx high LDL. Took statin in past-has intolerable SE-leg cramps. Does not want to restart statin. Discussed best pt outcomes in people w/dM if they take statin. She refuses med. Number have improved: triglyc 91, LDL 108, HDL 52.3. 10 yr risk for ASCVD is 37%. HTN: not at goal today-135/85.  Previous pressures at goal. Pt thinks she ate salty food last night. Discussed importance of daily exercise. Chronic UTI: 2 rounds cipro , 1 round augmentin since Oct. Asymptomatic. Will check again today.  The following portions of the patient's history were reviewed and updated as appropriate: allergies, current medications, past medical history, past social history, past surgical history and problem list.  Review of Systems Constitutional: negative for fatigue, fevers and night sweats Eyes: negative for visual disturbance Respiratory: negative for cough Cardiovascular: negative for exertional chest pressure/discomfort, irregular heart beat and lower extremity edema Gastrointestinal: negative for change in bowel habits Genitourinary:negative for dysuria and frequency Musculoskeletal:negative for myalgias Endocrine: negative for diabetic symptoms including polydipsia, polyphagia and polyuria and temperature intolerance    Objective:    BP 130/90 mmHg  Pulse 96  Temp(Src) 98.1 F (36.7 C) (Oral)  Resp 18  Ht 5\' 3"  (1.6 m)  Wt 206 lb (93.441 kg)  BMI 36.50 kg/m2  SpO2 93% BP 130/90 mmHg  Pulse 96  Temp(Src) 98.1 F (36.7 C) (Oral)  Resp 18  Ht 5\' 3"  (1.6 m)  Wt 206 lb (93.441 kg)  BMI 36.50 kg/m2  SpO2 93% General appearance: alert, cooperative,  appears stated age and no distress Head: Normocephalic, without obvious abnormality, atraumatic Eyes: negative findings: lids and lashes normal and conjunctivae and sclerae normal Neck: no carotid bruit and supple, symmetrical, trachea midline Lungs: clear to auscultation bilaterally Heart: regular rate and rhythm, S1, S2 normal, no murmur, click, rub or gallop Extremities: extremities normal, atraumatic, no cyanosis or edema and foot exam performed-see note Pulses: 2+ and symmetric    Assessment:Plan   1. Recent urinary tract infection - Urine culture  2. Type 2 diabetes mellitus without complication - metFORMIN (GLUCOPHAGE) 1000 MG tablet; TAKE ONE TABLET BY MOUTH EVERY DAY  Dispense: 90 tablet; Refill: 2  3. Essential hypertension, benign - lisinopril (PRINIVIL,ZESTRIL) 10 MG tablet; Take 1 tablet (10 mg total) by mouth daily.  Dispense: 90 tablet; Refill: 2  4. Immunization due - Tdap vaccine greater than or equal to 7yo IM  5. Hypercholesterolemia  See patient instructions for complete plan. F/u 6 mos.

## 2014-06-16 ENCOUNTER — Telehealth: Payer: Self-pay | Admitting: Nurse Practitioner

## 2014-06-16 LAB — URINE CULTURE: Colony Count: >=100000

## 2014-06-16 NOTE — Telephone Encounter (Signed)
pls call pt: Please let her know we need to re-collect urine. It should be collected in office-not brought from home. She needs to be instructed to not touch inside of cup or lid. She needs to wipe twice & collect mid-stream if able.

## 2014-06-17 NOTE — Telephone Encounter (Signed)
Patient notified

## 2014-06-21 ENCOUNTER — Telehealth: Payer: Self-pay

## 2014-06-21 DIAGNOSIS — R8271 Bacteriuria: Secondary | ICD-10-CM

## 2014-06-21 NOTE — Telephone Encounter (Signed)
Pt came by and dropped off urine specimen. Would you like a urine culture on this? Please advise.

## 2014-06-22 DIAGNOSIS — N390 Urinary tract infection, site not specified: Secondary | ICD-10-CM | POA: Diagnosis not present

## 2014-06-22 NOTE — Telephone Encounter (Signed)
Verbally spoke with Layne. Sent urine for a culture.

## 2014-06-22 NOTE — Telephone Encounter (Signed)
Perhaps she didn't understand my last message. She must come into office to give specimen. Why? She keeps bringing in urine & it grows multiple types bacteria, which means I will not get a sensitivity.  We have no idea how old the urine is, how long it sat out, or if she touched inside of lid, etc.  Please call her & advise we must collect a specimen in the office. Also, please give her thorough instructions at time of collection-she cannot touch lid or inside cup & should catch midstream.   Giving her antibiotic after antibiotic for poorly collected samples is not good medicine. Let's ensure we are getting a good sample.  Thanks for your help in this matter!

## 2014-06-27 ENCOUNTER — Ambulatory Visit: Payer: Medicare Other | Admitting: Internal Medicine

## 2014-08-16 ENCOUNTER — Telehealth: Payer: Self-pay | Admitting: Nurse Practitioner

## 2014-08-16 NOTE — Telephone Encounter (Signed)
CMA reported to me today, that Elaine Oconnell called her on the telephone (Friday, Feb 5)and was quit aggressive and used profanity in regards to a application for a handicap parking placard. CMA explained to pt that provider could not sign the application as she did not have a DX that was valid for a handicap placard.  Pt. came to office to pickup application and was rude and disrespectful to office staff. Pt stated that this office was incompetant and that she is"pissed" at [facility] for not signing the application. /DH            This note is  for documentation purposes only.

## 2014-08-16 NOTE — Telephone Encounter (Signed)
Noted  

## 2014-08-16 NOTE — Telephone Encounter (Signed)
Was given form for handicap place card on 08/12/14 by Layne with question about what the form was for, being that there was know reason in pt's chart for need of handicap place card. Called patient and asked why she needed the form filled out. Patient stated that the card was not for her personally but instead she needs card to drive her mother around. Advised pt to take form to her mothers pcp to have form filled by her mother's physician. Patient became beligerent and cussed on the phone demanding that Layne fill out the form. Told patient I would let Layne about form being for her mother and not for her and would call her when I got an answer.

## 2014-09-20 ENCOUNTER — Telehealth: Payer: Self-pay | Admitting: Cardiology

## 2014-09-20 DIAGNOSIS — E119 Type 2 diabetes mellitus without complications: Secondary | ICD-10-CM | POA: Diagnosis not present

## 2014-09-20 DIAGNOSIS — H2513 Age-related nuclear cataract, bilateral: Secondary | ICD-10-CM | POA: Diagnosis not present

## 2014-09-20 LAB — HM DIABETES EYE EXAM

## 2014-09-20 NOTE — Telephone Encounter (Signed)
Patient c/o Palpitations:  High priority if patient c/o lightheadedness and shortness of breath.  1. How long have you been having palpitations? For a while -2-3 months  2. Are you currently experiencing lightheadedness and shortness of breath?No ..Marland Kitchen Sometimes light headedness and she doesn do much to experience SOB// No energy   3. Have you checked your BP and heart rate? (document readings) No   4. Are you experiencing any other symptoms? Just rapid heart beat

## 2014-09-20 NOTE — Telephone Encounter (Signed)
Scheduled ov for patient to be seen Patient stated she had been having increased episodes of rapid heart rate

## 2014-09-23 ENCOUNTER — Ambulatory Visit (INDEPENDENT_AMBULATORY_CARE_PROVIDER_SITE_OTHER): Payer: Medicare Other | Admitting: Cardiology

## 2014-09-23 ENCOUNTER — Encounter: Payer: Self-pay | Admitting: Cardiology

## 2014-09-23 VITALS — BP 128/78 | HR 85 | Ht 63.0 in | Wt 210.4 lb

## 2014-09-23 DIAGNOSIS — F32A Depression, unspecified: Secondary | ICD-10-CM

## 2014-09-23 DIAGNOSIS — F329 Major depressive disorder, single episode, unspecified: Secondary | ICD-10-CM | POA: Diagnosis not present

## 2014-09-23 DIAGNOSIS — E7800 Pure hypercholesterolemia, unspecified: Secondary | ICD-10-CM

## 2014-09-23 DIAGNOSIS — I10 Essential (primary) hypertension: Secondary | ICD-10-CM | POA: Diagnosis not present

## 2014-09-23 DIAGNOSIS — I119 Hypertensive heart disease without heart failure: Secondary | ICD-10-CM | POA: Diagnosis not present

## 2014-09-23 NOTE — Patient Instructions (Signed)
Your physician recommends that you continue on your current medications as directed. Please refer to the Current Medication list given to you today.  Your physician wants you to follow-up in: 6 month ov You will receive a reminder letter in the mail two months in advance. If you don't receive a letter, please call our office to schedule the follow-up appointment.  

## 2014-09-23 NOTE — Progress Notes (Signed)
Cardiology Office Note   Date:  09/23/2014   ID:  Elaine, Oconnell [birth year], MRN [id number]  PCP:  Irene Pap, NP  Cardiologist:   Darlin Coco, MD   No chief complaint on file.     History of Present Illness: Elaine Oconnell is a 74 y.o. female who presents for follow-up office visit.  We last saw her in 2014.  This pleasant 74 year old woman has a past history of essential history of hypercholesterolemia. She is a mild diabetic. She has a history of inactive sarcoidosis. She has a history of osteoarthritis. She had a past history of palpitations.  She has a past history of anxiety and depression.  At her last visit she was having symptoms of situational depression.  Gave her a trial of citalopram.  However she read the side effects and decided not to take it.  Over the past year her symptoms of depression have improved on their own without medication. The patient has a history of exogenous obesity.  Unfortunately her weight is up 4 pounds since last visit.. The patient has not been having any chest pain or shortness of breath. Past Medical History  Diagnosis Date  . Personal history of sarcoidosis   . Hypertension   . Hypercholesterolemia   . Diverticulosis   . Obesity   . GERD (gastroesophageal reflux disease)     with HH  . Type II or unspecified type diabetes mellitus without mention of complication, uncontrolled   . PVC (premature ventricular contraction)   . Depression     Past Surgical History  Procedure Laterality Date  . Abdominal hysterectomy    . Cholecystectomy    . Appendectomy    . Septal deviation    . Turbinate hypertrophy    . Knee surgery  02/2011    left     Current Outpatient Prescriptions  Medication Sig Dispense Refill  . aspirin 325 MG tablet Take 325 mg by mouth as needed.     Marland Kitchen lisinopril (PRINIVIL,ZESTRIL) 10 MG tablet Take 1 tablet (10 mg total) by mouth daily. 90 tablet 2  . metFORMIN (GLUCOPHAGE) 1000 MG tablet TAKE ONE  TABLET BY MOUTH EVERY DAY 90 tablet 2   No current facility-administered medications for this visit.    Allergies:   Codeine; Lipitor; Naprosyn; and Sertraline hcl    Social History:  The patient  reports that she has never smoked. She has never used smokeless tobacco. She reports that she does not drink alcohol or use illicit drugs.   Family History:  The patient's family history includes Diabetes in her maternal grandfather; Heart disease in her father, maternal grandmother, paternal grandfather, and paternal grandmother; Hyperlipidemia in her father; Hypertension in her mother.    ROS:  Please see the history of present illness.   Otherwise, review of systems are positive for none.   All other systems are reviewed and negative.    PHYSICAL EXAM: VS:  BP 128/78 mmHg  Pulse 85  Ht 5\' 3"  (1.6 m)  Wt 210 lb 6.4 oz (95.437 kg)  BMI 37.28 kg/m2 , BMI Body mass index is 37.28 kg/(m^2). GEN: Well nourished, well developed, in no acute distress HEENT: normal Neck: no JVD, carotid bruits, or masses Cardiac: RRR; no murmurs, rubs, or gallops,no edema  Respiratory:  clear to auscultation bilaterally, normal work of breathing GI: soft, nontender, nondistended, + BS MS: no deformity or atrophy Skin: warm and dry, no rash Neuro:  Strength and sensation are intact  Psych: euthymic mood, full affect   EKG:  EKG is ordered today. The ekg ordered today demonstrates normal sinus rhythm with left axis deviation and low voltage QRS.  Since prior tracing of 07/30/11, no significant change   Recent Labs: 12/23/2013: Hemoglobin 13.9; Platelets 247.0; TSH 1.26 05/17/2014: ALT 11; BUN 16; Creatinine 0.8; Potassium 4.4; Sodium 141    Lipid Panel    Component Value Date/Time   CHOL 178 05/17/2014 1141   TRIG 91.0 05/17/2014 1141   HDL 52.30 05/17/2014 1141   CHOLHDL 3 05/17/2014 1141   VLDL 18.2 05/17/2014 1141   LDLCALC 108* 05/17/2014 1141      Wt Readings from Last 3 Encounters:    09/23/14 210 lb 6.4 oz (95.437 kg)  06/14/14 206 lb (93.441 kg)  05/17/14 207 lb (93.895 kg)        ASSESSMENT AND PLAN:  1.  Essential hypertension without heart failure 2.  Personal history of sarcoidosis, interactive 3.  Diabetes mellitus type 2 4.  Situational depression 5.  Hypercholesterolemia   Current medicines are reviewed at length with the patient today.  The patient does not have concerns regarding medicines.  The following changes have been made:  no change  Labs/ tests ordered today include:   Orders Placed This Encounter  Procedures  . EKG 12-Lead     Disposition: Continue on current medication.  She is not on any antidepressant but her mood appears to be improved.  She will work harder on weight loss.  Her weight is up 4 pounds.  Recheck in 6 months for follow-up office visit  Signed, Darlin Coco, MD  09/23/2014 6:17 PM    [address], [place], [place]  [address] Phone: [phone]; Fax: [phone]

## 2014-10-14 ENCOUNTER — Ambulatory Visit: Payer: Self-pay | Admitting: Nurse Practitioner

## 2014-11-08 DIAGNOSIS — H6983 Other specified disorders of Eustachian tube, bilateral: Secondary | ICD-10-CM | POA: Diagnosis not present

## 2014-11-08 DIAGNOSIS — M1990 Unspecified osteoarthritis, unspecified site: Secondary | ICD-10-CM | POA: Diagnosis not present

## 2014-11-08 DIAGNOSIS — E119 Type 2 diabetes mellitus without complications: Secondary | ICD-10-CM | POA: Diagnosis not present

## 2014-11-08 DIAGNOSIS — J309 Allergic rhinitis, unspecified: Secondary | ICD-10-CM | POA: Diagnosis not present

## 2014-11-08 DIAGNOSIS — I10 Essential (primary) hypertension: Secondary | ICD-10-CM | POA: Diagnosis not present

## 2014-11-10 DIAGNOSIS — J309 Allergic rhinitis, unspecified: Secondary | ICD-10-CM | POA: Diagnosis not present

## 2014-11-10 DIAGNOSIS — I10 Essential (primary) hypertension: Secondary | ICD-10-CM | POA: Diagnosis not present

## 2014-11-10 DIAGNOSIS — E119 Type 2 diabetes mellitus without complications: Secondary | ICD-10-CM | POA: Diagnosis not present

## 2014-11-10 DIAGNOSIS — H6983 Other specified disorders of Eustachian tube, bilateral: Secondary | ICD-10-CM | POA: Diagnosis not present

## 2014-12-27 ENCOUNTER — Ambulatory Visit: Payer: Self-pay | Admitting: Nurse Practitioner

## 2015-02-10 ENCOUNTER — Telehealth: Payer: Self-pay | Admitting: Cardiology

## 2015-02-10 NOTE — Telephone Encounter (Signed)
PT CALLED  AND  HAS FOUND PMD  DR  ROSS HAS  APPT  BEGINNING OF NOV  WANTED  TO  KNOW  IF NEEDS TO SEE  DR  BRACKBILL NOW  AS  WELL  WILL FORWARD TO  DR  Mare Ferrari AND MELINDA  TO  REVIEW  PT  AWARE WILL LET  KNOW  NEXT  WEEK .Adonis Housekeeper

## 2015-02-10 NOTE — Telephone Encounter (Signed)
Since she has appointment with PCP in November we could wait to see her next February or March.

## 2015-02-10 NOTE — Telephone Encounter (Signed)
New message      Pt want to ask the nurse a question.  She would not tell me what she wanted

## 2015-02-10 NOTE — Telephone Encounter (Signed)
PT  NOTIFIED  REMINDER  ENTERED .Adonis Housekeeper

## 2015-04-25 DIAGNOSIS — Z23 Encounter for immunization: Secondary | ICD-10-CM | POA: Diagnosis not present

## 2015-05-12 DIAGNOSIS — E119 Type 2 diabetes mellitus without complications: Secondary | ICD-10-CM | POA: Diagnosis not present

## 2015-05-12 DIAGNOSIS — F430 Acute stress reaction: Secondary | ICD-10-CM | POA: Diagnosis not present

## 2015-05-19 DIAGNOSIS — J209 Acute bronchitis, unspecified: Secondary | ICD-10-CM | POA: Diagnosis not present

## 2015-05-31 DIAGNOSIS — R05 Cough: Secondary | ICD-10-CM | POA: Diagnosis not present

## 2015-06-12 DIAGNOSIS — F430 Acute stress reaction: Secondary | ICD-10-CM | POA: Diagnosis not present

## 2015-06-12 DIAGNOSIS — E119 Type 2 diabetes mellitus without complications: Secondary | ICD-10-CM | POA: Diagnosis not present

## 2015-06-12 DIAGNOSIS — M461 Sacroiliitis, not elsewhere classified: Secondary | ICD-10-CM | POA: Diagnosis not present

## 2015-06-12 DIAGNOSIS — L821 Other seborrheic keratosis: Secondary | ICD-10-CM | POA: Diagnosis not present

## 2015-06-12 DIAGNOSIS — J321 Chronic frontal sinusitis: Secondary | ICD-10-CM | POA: Diagnosis not present

## 2015-06-20 DIAGNOSIS — Z1231 Encounter for screening mammogram for malignant neoplasm of breast: Secondary | ICD-10-CM | POA: Diagnosis not present

## 2015-07-30 DIAGNOSIS — S20479A Other superficial bite of unspecified back wall of thorax, initial encounter: Secondary | ICD-10-CM | POA: Diagnosis not present

## 2015-07-30 DIAGNOSIS — W57XXXA Bitten or stung by nonvenomous insect and other nonvenomous arthropods, initial encounter: Secondary | ICD-10-CM | POA: Diagnosis not present

## 2015-08-09 ENCOUNTER — Encounter: Payer: Self-pay | Admitting: Cardiology

## 2015-08-09 ENCOUNTER — Ambulatory Visit (INDEPENDENT_AMBULATORY_CARE_PROVIDER_SITE_OTHER): Payer: Medicare Other | Admitting: Cardiology

## 2015-08-09 VITALS — BP 126/80 | HR 100 | Ht 63.0 in | Wt 212.0 lb

## 2015-08-09 DIAGNOSIS — F329 Major depressive disorder, single episode, unspecified: Secondary | ICD-10-CM

## 2015-08-09 DIAGNOSIS — I10 Essential (primary) hypertension: Secondary | ICD-10-CM

## 2015-08-09 DIAGNOSIS — E7800 Pure hypercholesterolemia, unspecified: Secondary | ICD-10-CM | POA: Diagnosis not present

## 2015-08-09 DIAGNOSIS — F32A Depression, unspecified: Secondary | ICD-10-CM

## 2015-08-09 NOTE — Patient Instructions (Signed)
Medication Instructions:  Your physician recommends that you continue on your current medications as directed. Please refer to the Current Medication list given to you today.  Labwork: none  Testing/Procedures: none  Follow-Up: As needed   If you need a refill on your cardiac medications before your next appointment, please call your pharmacy.  

## 2015-08-09 NOTE — Progress Notes (Signed)
Cardiology Office Note   Date:  08/09/2015   ID:  [name], Walt [birth year], MRN [id number]  PCP:  Irene Pap, NP  Cardiologist: Darlin Coco MD  Chief Complaint  Patient presents with  . routine office viist      History of Present Illness: Elaine Oconnell is a 75 y.o. female who presents for a six-month follow-up office visit  This pleasant 75 year old woman has a past history of essential history of hypercholesterolemia. She is a mild diabetic. She has a history of inactive sarcoidosis. She has a history of osteoarthritis. She had a past history of palpitations. She has a past history of anxiety and depression. At her last visit she was having symptoms of situational depression. Gave her a trial of citalopram. However she read the side effects and decided not to take it. Over the past year her symptoms of depression have improved on their own without medication. The patient has a history of exogenous obesity. Unfortunately her weight is up 2 pounds since last visit.. The patient has not been having any chest pain or shortness of breath. The patient has had a lot of bronchitis over the winter.  Her primary care provider has treated her with antibiotics and with around prednisone. Last week the patient had a tick bite which embedded in her left posterior thorax area.  She was seen by primary care.  She is now on doxycycline for the tick bite.  Past Medical History  Diagnosis Date  . Personal history of sarcoidosis   . Hypertension   . Hypercholesterolemia   . Diverticulosis   . Obesity   . GERD (gastroesophageal reflux disease)     with HH  . Type II or unspecified type diabetes mellitus without mention of complication, uncontrolled   . PVC (premature ventricular contraction)   . Depression     Past Surgical History  Procedure Laterality Date  . Abdominal hysterectomy    . Cholecystectomy    . Appendectomy    . Septal deviation    . Turbinate  hypertrophy    . Knee surgery  02/2011    left     Current Outpatient Prescriptions  Medication Sig Dispense Refill  . aspirin 325 MG tablet Take 325 mg by mouth as needed for mild pain.     Marland Kitchen lisinopril (PRINIVIL,ZESTRIL) 10 MG tablet Take 1 tablet (10 mg total) by mouth daily. 90 tablet 2  . metFORMIN (GLUCOPHAGE) 1000 MG tablet TAKE ONE TABLET BY MOUTH EVERY DAY 90 tablet 2   No current facility-administered medications for this visit.    Allergies:   Codeine; Lipitor; Naprosyn; and Sertraline hcl    Social History:  The patient  reports that she has never smoked. She has never used smokeless tobacco. She reports that she does not drink alcohol or use illicit drugs.   Family History:  The patient's family history includes Diabetes in her maternal grandfather; Heart disease in her father, maternal grandmother, paternal grandfather, and paternal grandmother; Hyperlipidemia in her father; Hypertension in her mother.    ROS:  Please see the history of present illness.   Otherwise, review of systems are positive for none.   All other systems are reviewed and negative.    PHYSICAL EXAM: VS:  BP 126/80 mmHg  Pulse 100  Ht 5\' 3"  (1.6 m)  Wt 212 lb (96.163 kg)  BMI 37.56 kg/m2 , BMI Body mass index is 37.56 kg/(m^2). GEN: Well nourished, well developed, in no acute  distress HEENT: normal Neck: no JVD, carotid bruits, or masses Cardiac: RRR; no murmurs, rubs, or gallops,no edema  Respiratory:  clear to auscultation bilaterally, normal work of breathing GI: soft, nontender, nondistended, + BS MS: no deformity or atrophy Skin: warm and dry, no rash Neuro:  Strength and sensation are intact Psych: euthymic mood, full affect   EKG:  EKG is not ordered today.    Recent Labs: No results found for requested labs within last 365 days.    Lipid Panel    Component Value Date/Time   CHOL 178 05/17/2014 1141   TRIG 91.0 05/17/2014 1141   HDL 52.30 05/17/2014 1141   CHOLHDL 3  05/17/2014 1141   VLDL 18.2 05/17/2014 1141   LDLCALC 108* 05/17/2014 1141      Wt Readings from Last 3 Encounters:  08/09/15 212 lb (96.163 kg)  09/23/14 210 lb 6.4 oz (95.437 kg)  06/14/14 206 lb (93.441 kg)         ASSESSMENT AND PLAN:  1. Essential hypertension without heart failure 2. Personal history of sarcoidosis, interactive 3. Diabetes mellitus type 2 4. Situational depression 5. Hypercholesterolemia 6.  Recent tick bite, currently on doxycycline   Current medicines are reviewed at length with the patient today.  The patient does not have concerns regarding medicines.  The following changes have been made:  no change  Labs/ tests ordered today include:  No orders of the defined types were placed in this encounter.    Disposition: The patient will continue current medication.  She will follow-up with her primary care physician.  Return to cardiology on an as-needed basis.  Berna Spare MD 08/09/2015 5:54 PM    [facility] [address], [place], [place]  [address] Phone: [phone]; Fax: [phone]

## 2015-08-16 DIAGNOSIS — M129 Arthropathy, unspecified: Secondary | ICD-10-CM | POA: Diagnosis not present

## 2015-08-16 DIAGNOSIS — T148 Other injury of unspecified body region: Secondary | ICD-10-CM | POA: Diagnosis not present

## 2015-11-09 DIAGNOSIS — J329 Chronic sinusitis, unspecified: Secondary | ICD-10-CM | POA: Diagnosis not present

## 2015-11-09 DIAGNOSIS — L821 Other seborrheic keratosis: Secondary | ICD-10-CM | POA: Diagnosis not present

## 2015-11-09 DIAGNOSIS — Z7984 Long term (current) use of oral hypoglycemic drugs: Secondary | ICD-10-CM | POA: Diagnosis not present

## 2015-11-09 DIAGNOSIS — F430 Acute stress reaction: Secondary | ICD-10-CM | POA: Diagnosis not present

## 2015-11-09 DIAGNOSIS — E119 Type 2 diabetes mellitus without complications: Secondary | ICD-10-CM | POA: Diagnosis not present

## 2015-12-04 DIAGNOSIS — S70261A Insect bite (nonvenomous), right hip, initial encounter: Secondary | ICD-10-CM | POA: Diagnosis not present

## 2015-12-04 DIAGNOSIS — S70361A Insect bite (nonvenomous), right thigh, initial encounter: Secondary | ICD-10-CM | POA: Diagnosis not present

## 2015-12-04 DIAGNOSIS — S70362A Insect bite (nonvenomous), left thigh, initial encounter: Secondary | ICD-10-CM | POA: Diagnosis not present

## 2015-12-04 DIAGNOSIS — W57XXXA Bitten or stung by nonvenomous insect and other nonvenomous arthropods, initial encounter: Secondary | ICD-10-CM | POA: Diagnosis not present

## 2016-02-20 DIAGNOSIS — Z7984 Long term (current) use of oral hypoglycemic drugs: Secondary | ICD-10-CM | POA: Diagnosis not present

## 2016-02-20 DIAGNOSIS — T148 Other injury of unspecified body region: Secondary | ICD-10-CM | POA: Diagnosis not present

## 2016-02-20 DIAGNOSIS — E119 Type 2 diabetes mellitus without complications: Secondary | ICD-10-CM | POA: Diagnosis not present

## 2016-02-20 DIAGNOSIS — M25512 Pain in left shoulder: Secondary | ICD-10-CM | POA: Diagnosis not present

## 2016-03-21 DIAGNOSIS — R300 Dysuria: Secondary | ICD-10-CM | POA: Diagnosis not present

## 2016-03-21 DIAGNOSIS — L298 Other pruritus: Secondary | ICD-10-CM | POA: Diagnosis not present

## 2016-04-03 DIAGNOSIS — K219 Gastro-esophageal reflux disease without esophagitis: Secondary | ICD-10-CM | POA: Diagnosis not present

## 2016-04-03 DIAGNOSIS — K5792 Diverticulitis of intestine, part unspecified, without perforation or abscess without bleeding: Secondary | ICD-10-CM | POA: Diagnosis not present

## 2016-04-03 DIAGNOSIS — Z23 Encounter for immunization: Secondary | ICD-10-CM | POA: Diagnosis not present

## 2016-04-12 ENCOUNTER — Encounter: Payer: Self-pay | Admitting: Nurse Practitioner

## 2016-04-12 ENCOUNTER — Ambulatory Visit (INDEPENDENT_AMBULATORY_CARE_PROVIDER_SITE_OTHER): Payer: Medicare Other | Admitting: Nurse Practitioner

## 2016-04-12 ENCOUNTER — Encounter (INDEPENDENT_AMBULATORY_CARE_PROVIDER_SITE_OTHER): Payer: Self-pay

## 2016-04-12 VITALS — BP 126/76 | HR 70 | Ht 63.0 in | Wt 209.4 lb

## 2016-04-12 DIAGNOSIS — R140 Abdominal distension (gaseous): Secondary | ICD-10-CM | POA: Diagnosis not present

## 2016-04-12 NOTE — Patient Instructions (Signed)
Continue Protonix 40 mg , 1 tablet daily.  Take 1 Align capsule, take daily for 1 month.   We have provided  Reflux diet and Anti-gas diet brochures.

## 2016-04-12 NOTE — Progress Notes (Signed)
HPI: 75 year old female, formerly known to Dr. Delfin Edis. Last seen January 2013.  She has a history of diverticulosis and GERD. Up to date on CRC screening (2011).   Patient here with two-week history of abdominal bloating and discomfort. She ate at an [employer] early Sept. That evening developed severe lower abdominal pain. PCP, treated with a week of Augmentin for UTI per patient. Symptoms resolved but then after eating popcorn 2 weeks ago patient developed bloating, belching, and severe lower abdominal pain . Saw PCP, given Flagyl, metronidazole, and Protonix . She has nearly completed the antibiotics, just started the Protonix yesterday . Her lower abdominal pain has resolved since being on antibiotics, she still has upper abdominal bloating. She is passing some gas. No vomiting. Her stools are soft but infrequent because not eating much.    Past Medical History:  Diagnosis Date  . Depression   . Diverticulosis   . GERD (gastroesophageal reflux disease)    with HH  . Hypercholesterolemia   . Hypertension   . Obesity   . Personal history of sarcoidosis   . PVC (premature ventricular contraction)   . Type II or unspecified type diabetes mellitus without mention of complication, uncontrolled     Past Surgical History:  Procedure Laterality Date  . ABDOMINAL HYSTERECTOMY    . APPENDECTOMY    . CHOLECYSTECTOMY    . KNEE SURGERY  02/2011   left  . septal deviation    . turbinate hypertrophy     Family History  Problem Relation Age of Onset  . Hypertension Mother   . Heart disease Father   . Hyperlipidemia Father   . Heart disease Maternal Grandmother   . Heart disease Paternal Grandmother   . Heart disease Paternal Grandfather   . Diabetes Maternal Grandfather    Social History  Substance Use Topics  . Smoking status: Never Smoker  . Smokeless tobacco: Never Used  . Alcohol use No   Current Outpatient Prescriptions  Medication Sig Dispense Refill  .  aspirin 325 MG tablet Take 325 mg by mouth as needed for mild pain.     . ciprofloxacin (CIPRO) 500 MG tablet Take 500 mg by mouth 2 (two) times daily.    Marland Kitchen lisinopril (PRINIVIL,ZESTRIL) 10 MG tablet Take 1 tablet (10 mg total) by mouth daily. 90 tablet 2  . metFORMIN (GLUCOPHAGE) 1000 MG tablet TAKE ONE TABLET BY MOUTH EVERY DAY 90 tablet 2  . metroNIDAZOLE (FLAGYL) 500 MG tablet Take 500 mg by mouth 2 (two) times daily.     No current facility-administered medications for this visit.    Allergies  Allergen Reactions  . Codeine     REACTION: nausea  . Lipitor [Atorvastatin Calcium]   . Naprosyn [Naproxen]   . Sertraline Hcl     Review of Systems: Positive for arthritis, fatigue, underarm itching, muscle pain, sleeping problems. All other systems reviewed and negative except where noted in HPI.   Physical Exam: BP 126/76   Pulse 70   Ht 5\' 3"  (1.6 m)   Wt 209 lb 6.4 oz (95 kg)   SpO2 98%   BMI 37.09 kg/m  Constitutional: Pleasant, obese, white female in no acute distress. HEENT: Normocephalic and atraumatic. Conjunctivae are normal. No scleral icterus. Neck supple.  Cardiovascular: Normal rate, regular rhythm.  Pulmonary/chest: Effort normal and breath sounds normal. No wheezing, rales or rhonchi. Abdominal: Soft, nondistended, nontender. Bowel sounds active throughout. There are no masses palpable. No hepatomegaly. Extremities:  no edema Lymphadenopathy: No cervical adenopathy noted. Neurological: Alert and oriented to person place and time. Skin: Skin is warm and dry. No rashes noted. Psychiatric: Normal mood and affect. Behavior is normal.   ASSESSMENT AND PLAN: 75 year old female with abdominal pain and bloating since eating popcorn two weeks ago. Almost done with with cipro / flagyl presribed by PCP and lower abdominal pain has resolved. Diverticulitis?  Still having bleching and bloating. She has only taken one dose of PPI prescribed by PCP.   - Recommend she continue  the daily PPI prescribed by her PCP -complete the course of flagyl / cipro prescribed by PCP -anti-gas and GERD brochures given Trial of probiotics, may help gas / bloating given given all the recent antibiotics she has had -reassurance provided, patient was concerned about intestinal blockage / cancer.

## 2016-04-14 DIAGNOSIS — B370 Candidal stomatitis: Secondary | ICD-10-CM | POA: Diagnosis not present

## 2016-04-14 DIAGNOSIS — J029 Acute pharyngitis, unspecified: Secondary | ICD-10-CM | POA: Diagnosis not present

## 2016-04-14 NOTE — Progress Notes (Signed)
Agree with Ms. Guenther's assessment and plan. Emmalie Haigh E. Aitan Rossbach, MD, FACG   

## 2016-06-12 DIAGNOSIS — K219 Gastro-esophageal reflux disease without esophagitis: Secondary | ICD-10-CM | POA: Diagnosis not present

## 2016-06-12 DIAGNOSIS — Z6839 Body mass index (BMI) 39.0-39.9, adult: Secondary | ICD-10-CM | POA: Diagnosis not present

## 2016-06-12 DIAGNOSIS — Z7984 Long term (current) use of oral hypoglycemic drugs: Secondary | ICD-10-CM | POA: Diagnosis not present

## 2016-06-12 DIAGNOSIS — I10 Essential (primary) hypertension: Secondary | ICD-10-CM | POA: Diagnosis not present

## 2016-06-12 DIAGNOSIS — Z0000 Encounter for general adult medical examination without abnormal findings: Secondary | ICD-10-CM | POA: Diagnosis not present

## 2016-06-12 DIAGNOSIS — M1990 Unspecified osteoarthritis, unspecified site: Secondary | ICD-10-CM | POA: Diagnosis not present

## 2016-06-12 DIAGNOSIS — E119 Type 2 diabetes mellitus without complications: Secondary | ICD-10-CM | POA: Diagnosis not present

## 2016-06-24 DIAGNOSIS — Z1231 Encounter for screening mammogram for malignant neoplasm of breast: Secondary | ICD-10-CM | POA: Diagnosis not present

## 2016-10-30 DIAGNOSIS — H524 Presbyopia: Secondary | ICD-10-CM | POA: Diagnosis not present

## 2016-10-30 DIAGNOSIS — H5203 Hypermetropia, bilateral: Secondary | ICD-10-CM | POA: Diagnosis not present

## 2016-10-30 DIAGNOSIS — H2513 Age-related nuclear cataract, bilateral: Secondary | ICD-10-CM | POA: Diagnosis not present

## 2016-10-30 DIAGNOSIS — D3132 Benign neoplasm of left choroid: Secondary | ICD-10-CM | POA: Diagnosis not present

## 2016-10-30 DIAGNOSIS — H35361 Drusen (degenerative) of macula, right eye: Secondary | ICD-10-CM | POA: Diagnosis not present

## 2016-10-30 DIAGNOSIS — E119 Type 2 diabetes mellitus without complications: Secondary | ICD-10-CM | POA: Diagnosis not present

## 2016-11-04 DIAGNOSIS — L298 Other pruritus: Secondary | ICD-10-CM | POA: Diagnosis not present

## 2016-11-04 DIAGNOSIS — S30861A Insect bite (nonvenomous) of abdominal wall, initial encounter: Secondary | ICD-10-CM | POA: Diagnosis not present

## 2016-12-16 DIAGNOSIS — Z7984 Long term (current) use of oral hypoglycemic drugs: Secondary | ICD-10-CM | POA: Diagnosis not present

## 2016-12-16 DIAGNOSIS — E119 Type 2 diabetes mellitus without complications: Secondary | ICD-10-CM | POA: Diagnosis not present

## 2016-12-16 DIAGNOSIS — I10 Essential (primary) hypertension: Secondary | ICD-10-CM | POA: Diagnosis not present

## 2017-01-10 ENCOUNTER — Other Ambulatory Visit: Payer: Self-pay | Admitting: Cardiology

## 2017-01-10 DIAGNOSIS — R002 Palpitations: Secondary | ICD-10-CM | POA: Diagnosis not present

## 2017-01-10 DIAGNOSIS — R079 Chest pain, unspecified: Secondary | ICD-10-CM

## 2017-01-10 DIAGNOSIS — E785 Hyperlipidemia, unspecified: Secondary | ICD-10-CM | POA: Diagnosis not present

## 2017-01-10 DIAGNOSIS — F419 Anxiety disorder, unspecified: Secondary | ICD-10-CM | POA: Diagnosis not present

## 2017-01-10 DIAGNOSIS — E669 Obesity, unspecified: Secondary | ICD-10-CM | POA: Diagnosis not present

## 2017-01-10 DIAGNOSIS — K219 Gastro-esophageal reflux disease without esophagitis: Secondary | ICD-10-CM | POA: Diagnosis not present

## 2017-01-10 DIAGNOSIS — E119 Type 2 diabetes mellitus without complications: Secondary | ICD-10-CM | POA: Diagnosis not present

## 2017-01-10 DIAGNOSIS — M1990 Unspecified osteoarthritis, unspecified site: Secondary | ICD-10-CM | POA: Diagnosis not present

## 2017-01-10 DIAGNOSIS — R6884 Jaw pain: Secondary | ICD-10-CM | POA: Diagnosis not present

## 2017-01-10 DIAGNOSIS — I10 Essential (primary) hypertension: Secondary | ICD-10-CM | POA: Diagnosis not present

## 2017-01-20 ENCOUNTER — Encounter (HOSPITAL_COMMUNITY)
Admission: RE | Admit: 2017-01-20 | Discharge: 2017-01-20 | Disposition: A | Discharge status: Home / Self Care | Payer: Medicare Other | Source: Ambulatory Visit | Attending: Cardiology | Admitting: Cardiology

## 2017-01-20 DIAGNOSIS — R9431 Abnormal electrocardiogram [ECG] [EKG]: Secondary | ICD-10-CM | POA: Diagnosis not present

## 2017-01-20 DIAGNOSIS — I10 Essential (primary) hypertension: Secondary | ICD-10-CM | POA: Diagnosis not present

## 2017-01-20 DIAGNOSIS — R0789 Other chest pain: Secondary | ICD-10-CM | POA: Diagnosis not present

## 2017-01-20 DIAGNOSIS — R002 Palpitations: Secondary | ICD-10-CM | POA: Diagnosis not present

## 2017-01-20 DIAGNOSIS — R6884 Jaw pain: Secondary | ICD-10-CM | POA: Diagnosis not present

## 2017-01-20 DIAGNOSIS — R079 Chest pain, unspecified: Secondary | ICD-10-CM | POA: Diagnosis not present

## 2017-01-20 DIAGNOSIS — E119 Type 2 diabetes mellitus without complications: Secondary | ICD-10-CM | POA: Diagnosis not present

## 2017-01-20 LAB — CBC
HCT: 42.3 % (ref 36.0–46.0)
Hemoglobin: 13.8 g/dL (ref 12.0–15.0)
MCH: 30.5 pg (ref 26.0–34.0)
MCHC: 32.6 g/dL (ref 30.0–36.0)
MCV: 93.4 fL (ref 78.0–100.0)
PLATELETS: 223 10*3/uL (ref 150–400)
RBC: 4.53 MIL/uL (ref 3.87–5.11)
RDW: 13.1 % (ref 11.5–15.5)
WBC: 7 10*3/uL (ref 4.0–10.5)

## 2017-01-20 LAB — BASIC METABOLIC PANEL
Anion gap: 8 (ref 5–15)
BUN: 15 mg/dL (ref 6–20)
CALCIUM: 9.3 mg/dL (ref 8.9–10.3)
CO2: 29 mmol/L (ref 22–32)
CREATININE: 0.91 mg/dL (ref 0.44–1.00)
Chloride: 98 mmol/L — ABNORMAL LOW (ref 101–111)
GFR, EST NON AFRICAN AMERICAN: 59 mL/min — AB (ref >60)
Glucose, Bld: 136 mg/dL — ABNORMAL HIGH (ref 65–99)
Potassium: 4 mmol/L (ref 3.5–5.1)
SODIUM: 135 mmol/L (ref 135–145)

## 2017-01-20 LAB — HEPATIC FUNCTION PANEL
ALT: 14 U/L (ref 14–54)
AST: 21 U/L (ref 15–41)
Albumin: 3.8 g/dL (ref 3.5–5.0)
Alkaline Phosphatase: 87 U/L (ref 38–126)
Bilirubin, Direct: 0.2 mg/dL (ref 0.1–0.5)
Indirect Bilirubin: 0.6 mg/dL (ref 0.3–0.9)
TOTAL PROTEIN: 6.9 g/dL (ref 6.5–8.1)
Total Bilirubin: 0.8 mg/dL (ref 0.3–1.2)

## 2017-01-20 LAB — LIPID PANEL
CHOL/HDL RATIO: 3.4 ratio
Cholesterol: 182 mg/dL (ref 0–200)
HDL: 53 mg/dL (ref >40)
LDL CALC: 106 mg/dL — AB (ref 0–99)
Triglycerides: 114 mg/dL (ref <150)
VLDL: 23 mg/dL (ref 0–40)

## 2017-01-20 LAB — PROTIME-INR
INR: 0.94
Prothrombin Time: 12.6 seconds (ref 11.4–15.2)

## 2017-01-20 MED ORDER — TECHNETIUM TC 99M TETROFOSMIN IV KIT
30.0000 | PACK | Freq: Once | INTRAVENOUS | Status: AC | PRN
  Administered 2017-01-20: 30 via INTRAVENOUS

## 2017-01-20 MED ORDER — REGADENOSON 0.4 MG/5ML IV SOLN
INTRAVENOUS | Status: AC
  Filled 2017-01-20: qty 5

## 2017-01-20 MED ORDER — ACETAMINOPHEN 325 MG PO TABS
ORAL_TABLET | ORAL | Status: AC
  Filled 2017-01-20: qty 2

## 2017-01-20 MED ORDER — ACETAMINOPHEN 325 MG PO TABS
650.0000 mg | ORAL_TABLET | Freq: Once | ORAL | Status: AC
  Administered 2017-01-20: 650 mg via ORAL

## 2017-01-20 MED ORDER — TECHNETIUM TC 99M TETROFOSMIN IV KIT
10.0000 | PACK | Freq: Once | INTRAVENOUS | Status: AC | PRN
  Administered 2017-01-20: 10 via INTRAVENOUS

## 2017-01-20 MED ORDER — REGADENOSON 0.4 MG/5ML IV SOLN
0.4000 mg | Freq: Once | INTRAVENOUS | Status: AC
  Administered 2017-01-20: 0.4 mg via INTRAVENOUS

## 2017-01-22 DIAGNOSIS — R002 Palpitations: Secondary | ICD-10-CM | POA: Diagnosis not present

## 2017-01-22 DIAGNOSIS — I119 Hypertensive heart disease without heart failure: Secondary | ICD-10-CM | POA: Diagnosis not present

## 2017-01-22 DIAGNOSIS — E669 Obesity, unspecified: Secondary | ICD-10-CM | POA: Diagnosis not present

## 2017-01-22 DIAGNOSIS — E785 Hyperlipidemia, unspecified: Secondary | ICD-10-CM | POA: Diagnosis not present

## 2017-01-22 DIAGNOSIS — E119 Type 2 diabetes mellitus without complications: Secondary | ICD-10-CM | POA: Diagnosis not present

## 2017-01-22 DIAGNOSIS — I25118 Atherosclerotic heart disease of native coronary artery with other forms of angina pectoris: Secondary | ICD-10-CM | POA: Diagnosis not present

## 2017-01-22 DIAGNOSIS — R9439 Abnormal result of other cardiovascular function study: Secondary | ICD-10-CM | POA: Diagnosis not present

## 2017-01-22 LAB — VITAMIN D 25 HYDROXY (VIT D DEFICIENCY, FRACTURES): Vit D, 25-Hydroxy: 29.8 ng/mL — ABNORMAL LOW (ref 30.0–100.0)

## 2017-01-22 LAB — HEMOGLOBIN A1C
HEMOGLOBIN A1C: 6.9 % — AB (ref 4.8–5.6)
MEAN PLASMA GLUCOSE: 151 mg/dL

## 2017-01-23 DIAGNOSIS — E785 Hyperlipidemia, unspecified: Secondary | ICD-10-CM | POA: Diagnosis not present

## 2017-01-23 DIAGNOSIS — E6609 Other obesity due to excess calories: Secondary | ICD-10-CM | POA: Diagnosis not present

## 2017-01-23 DIAGNOSIS — E119 Type 2 diabetes mellitus without complications: Secondary | ICD-10-CM | POA: Diagnosis not present

## 2017-01-23 DIAGNOSIS — I25118 Atherosclerotic heart disease of native coronary artery with other forms of angina pectoris: Secondary | ICD-10-CM | POA: Diagnosis not present

## 2017-01-23 DIAGNOSIS — Z6837 Body mass index (BMI) 37.0-37.9, adult: Secondary | ICD-10-CM | POA: Diagnosis not present

## 2017-01-23 DIAGNOSIS — I252 Old myocardial infarction: Secondary | ICD-10-CM | POA: Diagnosis not present

## 2017-01-23 DIAGNOSIS — R9439 Abnormal result of other cardiovascular function study: Secondary | ICD-10-CM | POA: Diagnosis not present

## 2017-01-23 DIAGNOSIS — R002 Palpitations: Secondary | ICD-10-CM | POA: Diagnosis not present

## 2017-01-23 DIAGNOSIS — I119 Hypertensive heart disease without heart failure: Secondary | ICD-10-CM | POA: Diagnosis not present

## 2017-02-19 DIAGNOSIS — I252 Old myocardial infarction: Secondary | ICD-10-CM | POA: Diagnosis not present

## 2017-02-19 DIAGNOSIS — I5020 Unspecified systolic (congestive) heart failure: Secondary | ICD-10-CM | POA: Diagnosis not present

## 2017-02-19 DIAGNOSIS — I119 Hypertensive heart disease without heart failure: Secondary | ICD-10-CM | POA: Diagnosis not present

## 2017-02-19 DIAGNOSIS — I25118 Atherosclerotic heart disease of native coronary artery with other forms of angina pectoris: Secondary | ICD-10-CM | POA: Diagnosis not present

## 2017-02-19 DIAGNOSIS — E119 Type 2 diabetes mellitus without complications: Secondary | ICD-10-CM | POA: Diagnosis not present

## 2017-02-19 DIAGNOSIS — R002 Palpitations: Secondary | ICD-10-CM | POA: Diagnosis not present

## 2017-02-26 DIAGNOSIS — R300 Dysuria: Secondary | ICD-10-CM | POA: Diagnosis not present

## 2017-03-31 DIAGNOSIS — Z23 Encounter for immunization: Secondary | ICD-10-CM | POA: Diagnosis not present

## 2017-04-03 DIAGNOSIS — M79605 Pain in left leg: Secondary | ICD-10-CM | POA: Diagnosis not present

## 2017-04-11 DIAGNOSIS — J329 Chronic sinusitis, unspecified: Secondary | ICD-10-CM | POA: Diagnosis not present

## 2017-04-11 DIAGNOSIS — R05 Cough: Secondary | ICD-10-CM | POA: Diagnosis not present

## 2017-05-01 DIAGNOSIS — I119 Hypertensive heart disease without heart failure: Secondary | ICD-10-CM | POA: Diagnosis not present

## 2017-05-01 DIAGNOSIS — E6609 Other obesity due to excess calories: Secondary | ICD-10-CM | POA: Diagnosis not present

## 2017-05-01 DIAGNOSIS — M1990 Unspecified osteoarthritis, unspecified site: Secondary | ICD-10-CM | POA: Diagnosis not present

## 2017-05-01 DIAGNOSIS — E785 Hyperlipidemia, unspecified: Secondary | ICD-10-CM | POA: Diagnosis not present

## 2017-05-01 DIAGNOSIS — I252 Old myocardial infarction: Secondary | ICD-10-CM | POA: Diagnosis not present

## 2017-05-01 DIAGNOSIS — Z6837 Body mass index (BMI) 37.0-37.9, adult: Secondary | ICD-10-CM | POA: Diagnosis not present

## 2017-05-01 DIAGNOSIS — I25118 Atherosclerotic heart disease of native coronary artery with other forms of angina pectoris: Secondary | ICD-10-CM | POA: Diagnosis not present

## 2017-05-01 DIAGNOSIS — E119 Type 2 diabetes mellitus without complications: Secondary | ICD-10-CM | POA: Diagnosis not present

## 2017-05-01 DIAGNOSIS — I5020 Unspecified systolic (congestive) heart failure: Secondary | ICD-10-CM | POA: Diagnosis not present

## 2017-06-13 ENCOUNTER — Telehealth: Payer: Self-pay | Admitting: Nurse Practitioner

## 2017-06-13 ENCOUNTER — Other Ambulatory Visit: Payer: Self-pay

## 2017-06-13 MED ORDER — AMOXICILLIN-POT CLAVULANATE 875-125 MG PO TABS: 1.0000 | ORAL_TABLET | Freq: Two times a day (BID) | ORAL | 0 refills | Status: AC

## 2017-06-13 NOTE — Telephone Encounter (Signed)
Calls with complaints of lower abdominal discomfort and bloating. She feels it is related to the coconut pie she ate. Feels constipated but her bowels are moving normally. She has taken Gas-X without improvement of her bloating. She takes daily Protonix. Denies reflux, but does burp.

## 2017-06-13 NOTE — Telephone Encounter (Signed)
Augment 875 mg bid w/ food x 7 days

## 2017-06-25 DIAGNOSIS — I119 Hypertensive heart disease without heart failure: Secondary | ICD-10-CM | POA: Diagnosis not present

## 2017-06-25 DIAGNOSIS — M1990 Unspecified osteoarthritis, unspecified site: Secondary | ICD-10-CM | POA: Diagnosis not present

## 2017-06-25 DIAGNOSIS — E785 Hyperlipidemia, unspecified: Secondary | ICD-10-CM | POA: Diagnosis not present

## 2017-06-25 DIAGNOSIS — K219 Gastro-esophageal reflux disease without esophagitis: Secondary | ICD-10-CM | POA: Diagnosis not present

## 2017-06-25 DIAGNOSIS — R9439 Abnormal result of other cardiovascular function study: Secondary | ICD-10-CM | POA: Diagnosis not present

## 2017-06-25 DIAGNOSIS — I252 Old myocardial infarction: Secondary | ICD-10-CM | POA: Diagnosis not present

## 2017-06-25 DIAGNOSIS — I5020 Unspecified systolic (congestive) heart failure: Secondary | ICD-10-CM | POA: Diagnosis not present

## 2017-06-25 DIAGNOSIS — E119 Type 2 diabetes mellitus without complications: Secondary | ICD-10-CM | POA: Diagnosis not present

## 2017-06-25 DIAGNOSIS — I25119 Atherosclerotic heart disease of native coronary artery with unspecified angina pectoris: Secondary | ICD-10-CM | POA: Diagnosis not present

## 2017-06-26 DIAGNOSIS — I2510 Atherosclerotic heart disease of native coronary artery without angina pectoris: Secondary | ICD-10-CM | POA: Diagnosis not present

## 2017-06-26 DIAGNOSIS — I10 Essential (primary) hypertension: Secondary | ICD-10-CM | POA: Diagnosis not present

## 2017-06-26 DIAGNOSIS — E785 Hyperlipidemia, unspecified: Secondary | ICD-10-CM | POA: Diagnosis not present

## 2017-07-03 ENCOUNTER — Ambulatory Visit
Admission: RE | Admit: 2017-07-03 | Discharge: 2017-07-03 | Disposition: A | Discharge status: Home / Self Care | Payer: Medicare Other | Source: Ambulatory Visit | Attending: Family Medicine | Admitting: Family Medicine

## 2017-07-03 ENCOUNTER — Other Ambulatory Visit: Payer: Self-pay | Admitting: Family Medicine

## 2017-07-03 DIAGNOSIS — K5730 Diverticulosis of large intestine without perforation or abscess without bleeding: Secondary | ICD-10-CM | POA: Diagnosis not present

## 2017-07-03 DIAGNOSIS — N3001 Acute cystitis with hematuria: Secondary | ICD-10-CM

## 2017-07-03 DIAGNOSIS — R309 Painful micturition, unspecified: Secondary | ICD-10-CM | POA: Diagnosis not present

## 2017-07-03 IMAGING — CT CT ABD-PELV W/O CM
2 of 4 series · 16 of 46 positions shown, 18 images · non-contrast
Comparison: [DATE]

CLINICAL DATA: Recurrent UTI, microscopic hematuria for several
weeks, history hypertension, type II diabetes mellitus,
diverticulosis, GERD

EXAM:
CT ABDOMEN AND PELVIS WITHOUT CONTRAST
TECHNIQUE: Multidetector CT imaging of the abdomen and pelvis was performed
following the standard protocol without IV contrast. Sagittal and
coronal MPR images reconstructed from axial data set. Oral contrast
was not administered

[Series 2: renal standard/full · axial · 0.91mm/px · z∈[+433,+873]mm · 13 of 96 slices shown, 15 images]
[im 4/96  soft-tissue]
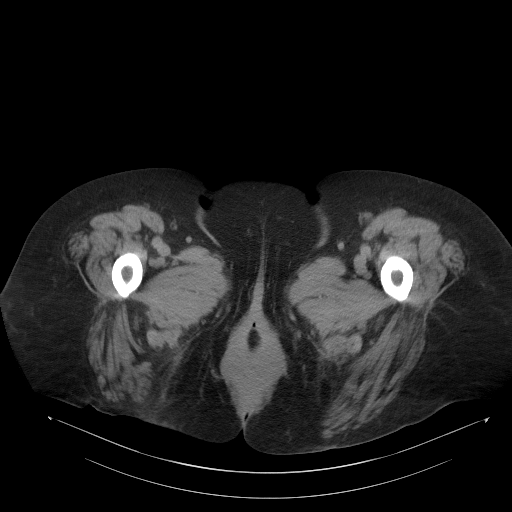
[im 4/96  bone]
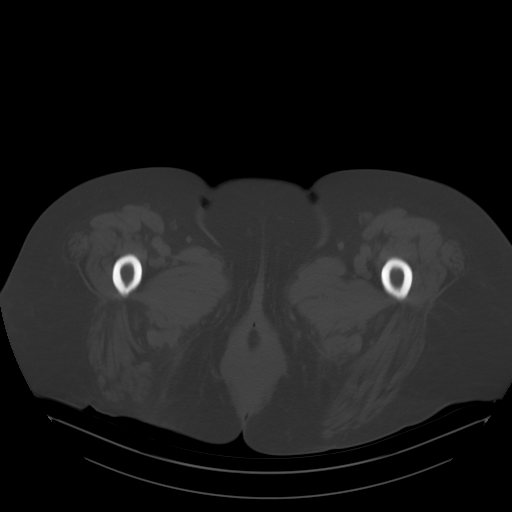
[im 12/96  soft-tissue]
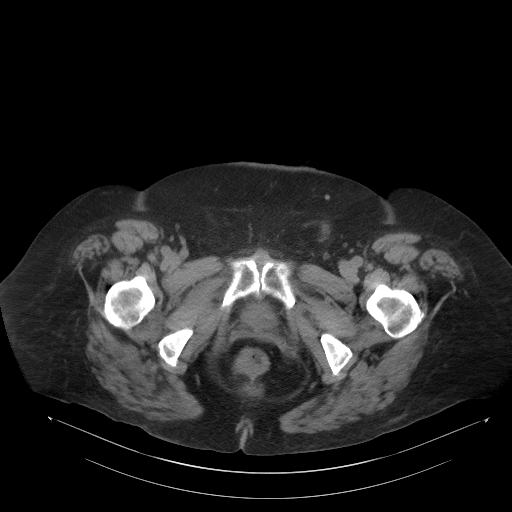
[im 20/96  soft-tissue]
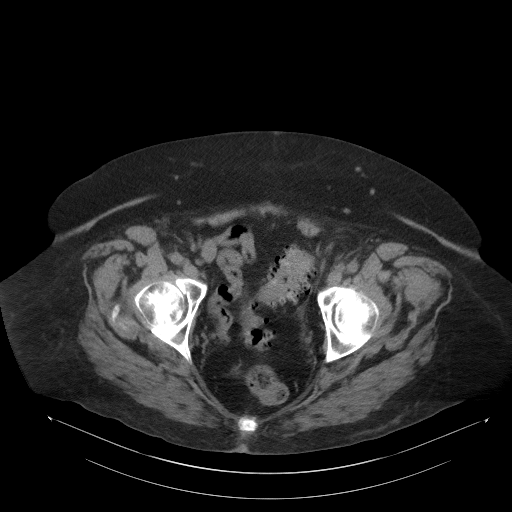
[im 27/96  soft-tissue]
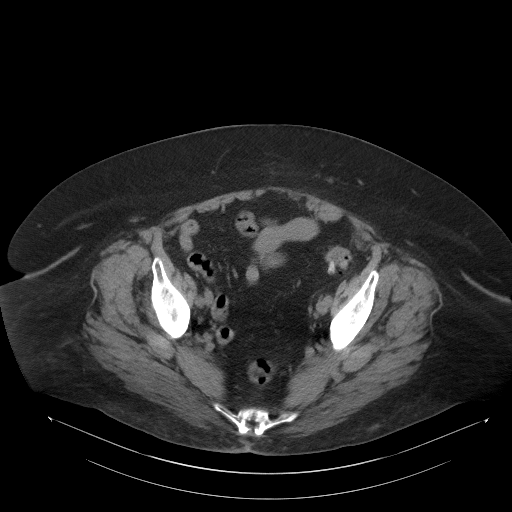
[im 35/96  soft-tissue]
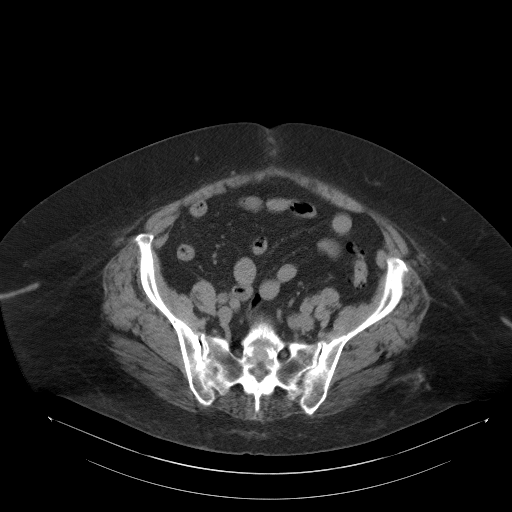
[im 42/96  soft-tissue]
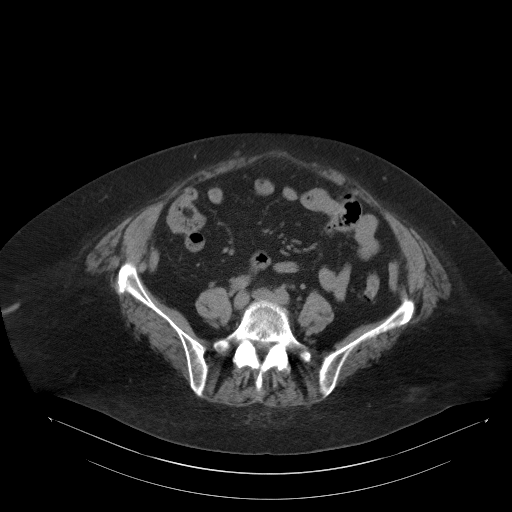
[im 50/96  soft-tissue]
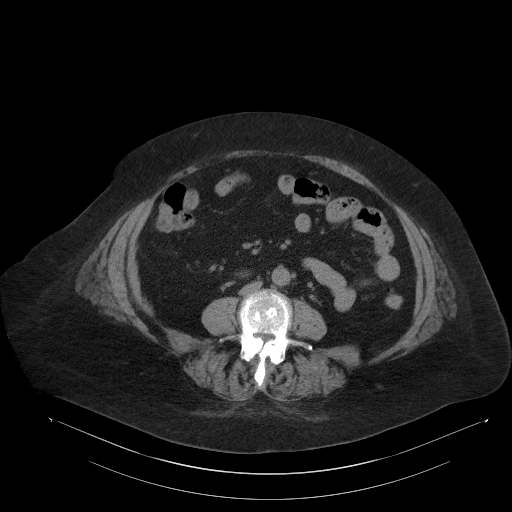
[im 54/96  soft-tissue]
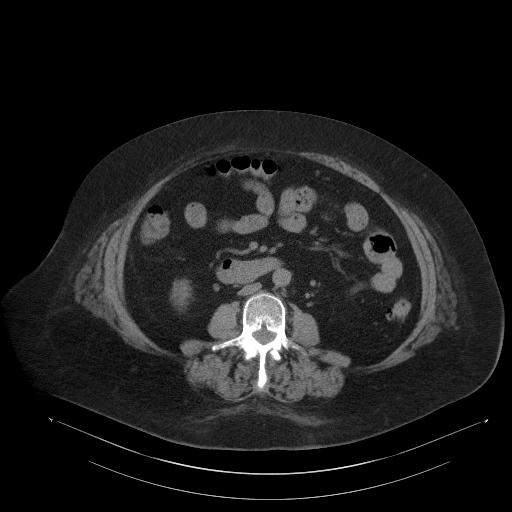
[im 61/96  soft-tissue]
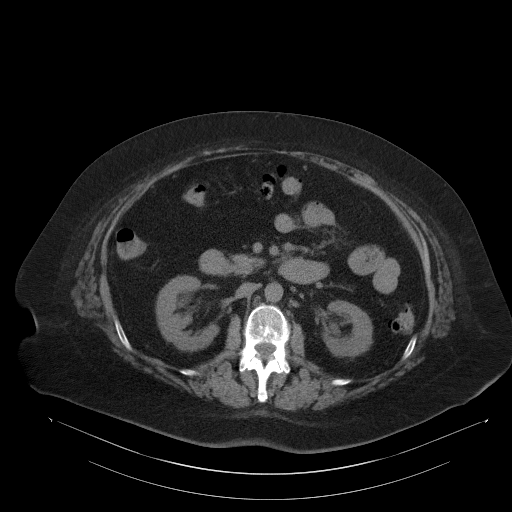
[im 61/96  bone]
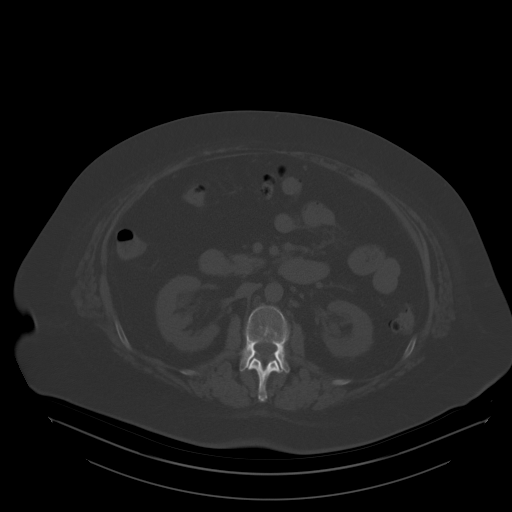
[im 69/96  soft-tissue]
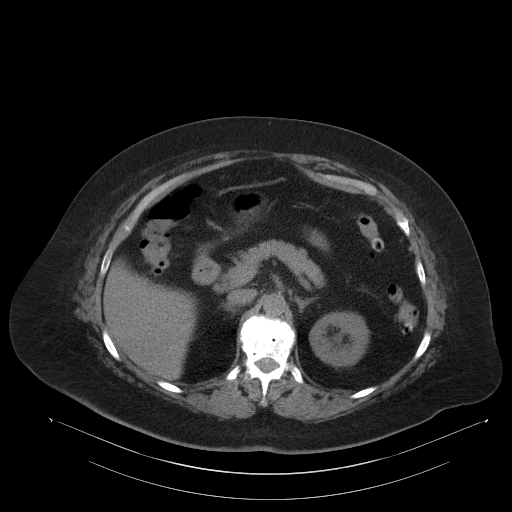
[im 77/96  soft-tissue]
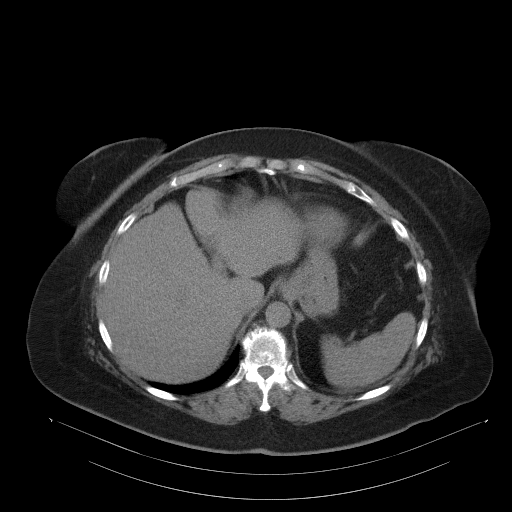
[im 84/96  soft-tissue]
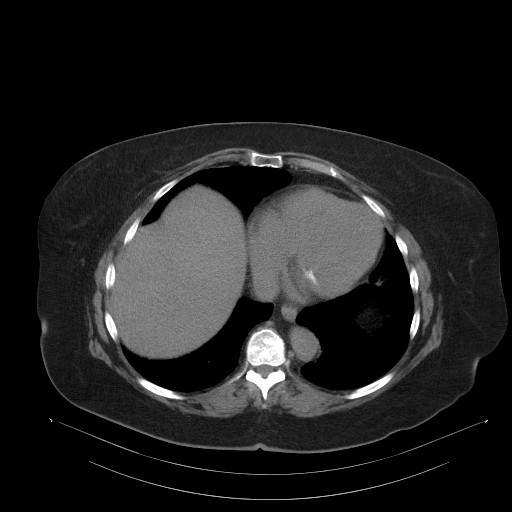
[im 92/96  soft-tissue]
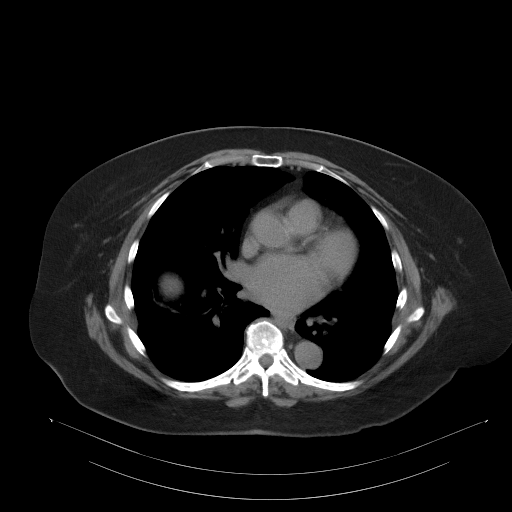

[Series 3: cor · coronal · 0.84mm/px · 3 of 100 slices shown]
[im 34/100  soft-tissue]
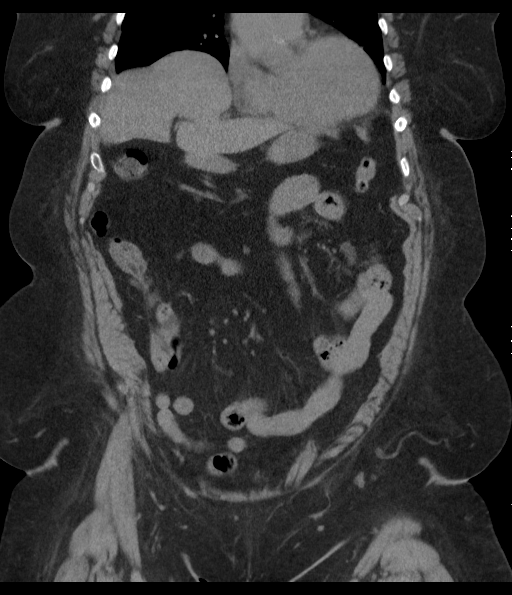
[im 45/100  soft-tissue]
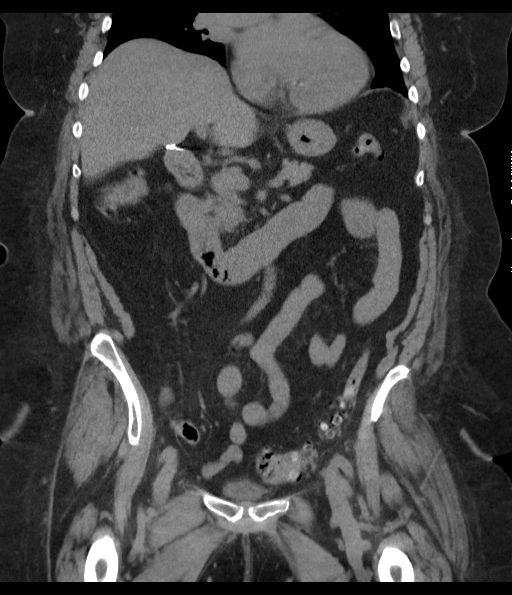
[im 56/100  soft-tissue]
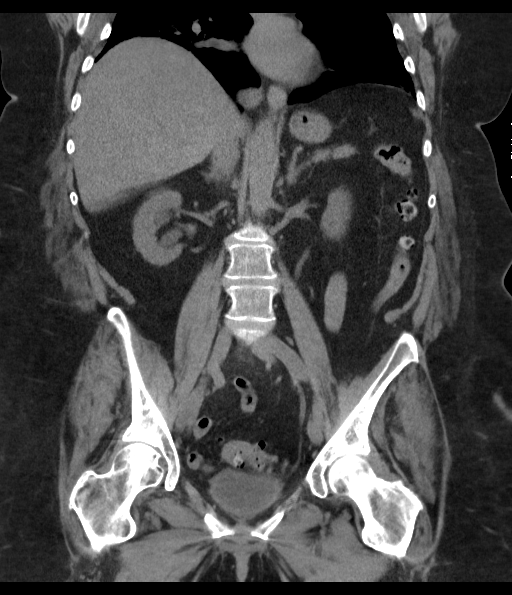

[16 of 46 positions shown; findings below may reference images not displayed]

FINDINGS: Lower chest: Minimal atelectasis at lung bases

Hepatobiliary: Gallbladder surgically absent.  Liver unremarkable.

Pancreas: Normal appearance

Spleen: Normal appearance

Adrenals/Urinary Tract: Adrenal glands normal appearance. Peripelvic
cysts at both kidneys as seen on previous exam. Kidneys, ureters,
and bladder otherwise normal appearance. No urinary tract
calcification or dilatation.

Stomach/Bowel: Appendix surgically absent by history. Stomach
decompressed with suboptimal assessment of proximal wall thickness.
Small bowel loops unremarkable. Diverticulosis of descending and
sigmoid colon with though if the diverticulitis. Additional
scattered diverticula at ascending and transverse colon.

Vascular/Lymphatic: Minimal atherosclerotic calcification aorta.
Aorta normal caliber. No adenopathy.

Reproductive: Uterus surgically absent. Nonvisualization of ovaries.

Other: Small umbilical hernia containing fat. BILATERAL inguinal
hernias containing fat, larger on LEFT. No free air or free fluid.

Musculoskeletal: Osseous demineralization. Mild scattered
degenerative disc and facet disease changes of thoracolumbar spine.
IMPRESSION: Scattered colonic diverticulosis without evidence of diverticulitis.

BILATERAL peripelvic cysts at both kidneys with urinary tract L3
blood unremarkable.

Post cholecystectomy and hysterectomy with nonvisualization of
ovaries.

Umbilical and inguinal hernias containing fat.

## 2017-07-08 DIAGNOSIS — N390 Urinary tract infection, site not specified: Secondary | ICD-10-CM | POA: Diagnosis not present

## 2017-07-08 DIAGNOSIS — F439 Reaction to severe stress, unspecified: Secondary | ICD-10-CM | POA: Diagnosis not present

## 2017-07-08 DIAGNOSIS — N952 Postmenopausal atrophic vaginitis: Secondary | ICD-10-CM | POA: Diagnosis not present

## 2017-07-08 DIAGNOSIS — R300 Dysuria: Secondary | ICD-10-CM | POA: Diagnosis not present

## 2017-07-10 ENCOUNTER — Encounter (HOSPITAL_COMMUNITY)
Admission: RE | Disposition: A | Discharge status: Home / Self Care | Payer: Self-pay | Source: Ambulatory Visit | Attending: Cardiology

## 2017-07-10 ENCOUNTER — Ambulatory Visit (HOSPITAL_COMMUNITY)
Admission: RE | Admit: 2017-07-10 | Discharge: 2017-07-10 | Disposition: A | Discharge status: Home / Self Care | Payer: Medicare Other | Source: Ambulatory Visit | Attending: Cardiology | Admitting: Cardiology

## 2017-07-10 ENCOUNTER — Encounter (HOSPITAL_COMMUNITY): Payer: Self-pay | Admitting: Cardiology

## 2017-07-10 DIAGNOSIS — Z6837 Body mass index (BMI) 37.0-37.9, adult: Secondary | ICD-10-CM | POA: Insufficient documentation

## 2017-07-10 DIAGNOSIS — I252 Old myocardial infarction: Secondary | ICD-10-CM | POA: Diagnosis not present

## 2017-07-10 DIAGNOSIS — Z7982 Long term (current) use of aspirin: Secondary | ICD-10-CM | POA: Insufficient documentation

## 2017-07-10 DIAGNOSIS — I25119 Atherosclerotic heart disease of native coronary artery with unspecified angina pectoris: Secondary | ICD-10-CM | POA: Insufficient documentation

## 2017-07-10 DIAGNOSIS — Z79899 Other long term (current) drug therapy: Secondary | ICD-10-CM | POA: Insufficient documentation

## 2017-07-10 DIAGNOSIS — R9439 Abnormal result of other cardiovascular function study: Secondary | ICD-10-CM | POA: Insufficient documentation

## 2017-07-10 DIAGNOSIS — Z7984 Long term (current) use of oral hypoglycemic drugs: Secondary | ICD-10-CM | POA: Diagnosis not present

## 2017-07-10 DIAGNOSIS — I10 Essential (primary) hypertension: Secondary | ICD-10-CM | POA: Diagnosis not present

## 2017-07-10 DIAGNOSIS — Z7902 Long term (current) use of antithrombotics/antiplatelets: Secondary | ICD-10-CM | POA: Insufficient documentation

## 2017-07-10 DIAGNOSIS — E119 Type 2 diabetes mellitus without complications: Secondary | ICD-10-CM | POA: Insufficient documentation

## 2017-07-10 DIAGNOSIS — E785 Hyperlipidemia, unspecified: Secondary | ICD-10-CM | POA: Diagnosis not present

## 2017-07-10 DIAGNOSIS — I5020 Unspecified systolic (congestive) heart failure: Secondary | ICD-10-CM | POA: Diagnosis not present

## 2017-07-10 DIAGNOSIS — I119 Hypertensive heart disease without heart failure: Secondary | ICD-10-CM | POA: Diagnosis not present

## 2017-07-10 DIAGNOSIS — M1990 Unspecified osteoarthritis, unspecified site: Secondary | ICD-10-CM | POA: Insufficient documentation

## 2017-07-10 DIAGNOSIS — F329 Major depressive disorder, single episode, unspecified: Secondary | ICD-10-CM | POA: Diagnosis not present

## 2017-07-10 DIAGNOSIS — I2510 Atherosclerotic heart disease of native coronary artery without angina pectoris: Secondary | ICD-10-CM | POA: Diagnosis not present

## 2017-07-10 DIAGNOSIS — K219 Gastro-esophageal reflux disease without esophagitis: Secondary | ICD-10-CM | POA: Insufficient documentation

## 2017-07-10 HISTORY — PX: LEFT HEART CATH AND CORONARY ANGIOGRAPHY: CATH118249

## 2017-07-10 LAB — GLUCOSE, CAPILLARY
GLUCOSE-CAPILLARY: 133 mg/dL — AB (ref 65–99)
Glucose-Capillary: 152 mg/dL — ABNORMAL HIGH (ref 65–99)

## 2017-07-10 SURGERY — LEFT HEART CATH AND CORONARY ANGIOGRAPHY
Anesthesia: LOCAL

## 2017-07-10 MED ORDER — ASPIRIN 81 MG PO CHEW
81.0000 mg | CHEWABLE_TABLET | ORAL | Status: AC
  Administered 2017-07-10: 81 mg via ORAL

## 2017-07-10 MED ORDER — FENTANYL CITRATE (PF) 100 MCG/2ML IJ SOLN
INTRAMUSCULAR | Status: AC
  Filled 2017-07-10: qty 2

## 2017-07-10 MED ORDER — FENTANYL CITRATE (PF) 100 MCG/2ML IJ SOLN
INTRAMUSCULAR | Status: DC | PRN
  Administered 2017-07-10 (×2): 25 ug via INTRAVENOUS

## 2017-07-10 MED ORDER — LIDOCAINE HCL (PF) 1 % IJ SOLN
INTRAMUSCULAR | Status: DC | PRN
  Administered 2017-07-10: 26 mL

## 2017-07-10 MED ORDER — SODIUM CHLORIDE 0.9% FLUSH: 3.0000 mL | INTRAVENOUS | Status: DC | PRN

## 2017-07-10 MED ORDER — SODIUM CHLORIDE 0.9 % IV SOLN: INTRAVENOUS | Status: AC

## 2017-07-10 MED ORDER — HEPARIN (PORCINE) IN NACL 2-0.9 UNIT/ML-% IJ SOLN
INTRAMUSCULAR | Status: AC | PRN
  Administered 2017-07-10: 250 mL/h
  Administered 2017-07-10: 1000 mL via INTRA_ARTERIAL

## 2017-07-10 MED ORDER — HEPARIN (PORCINE) IN NACL 2-0.9 UNIT/ML-% IJ SOLN
INTRAMUSCULAR | Status: AC
  Filled 2017-07-10: qty 1000

## 2017-07-10 MED ORDER — SODIUM CHLORIDE 0.9 % WEIGHT BASED INFUSION
3.0000 mL/kg/h | INTRAVENOUS | Status: AC
  Administered 2017-07-10: 3 mL/kg/h via INTRAVENOUS

## 2017-07-10 MED ORDER — SODIUM CHLORIDE 0.9 % WEIGHT BASED INFUSION: 1.0000 mL/kg/h | INTRAVENOUS | Status: DC

## 2017-07-10 MED ORDER — MIDAZOLAM HCL 2 MG/2ML IJ SOLN
INTRAMUSCULAR | Status: DC | PRN
  Administered 2017-07-10 (×2): 1 mg via INTRAVENOUS

## 2017-07-10 MED ORDER — CLOPIDOGREL BISULFATE 75 MG PO TABS
75.0000 mg | ORAL_TABLET | Freq: Once | ORAL | Status: AC
  Administered 2017-07-10: 75 mg via ORAL

## 2017-07-10 MED ORDER — SODIUM CHLORIDE 0.9% FLUSH: 3.0000 mL | Freq: Two times a day (BID) | INTRAVENOUS | Status: DC

## 2017-07-10 MED ORDER — ASPIRIN 81 MG PO CHEW
CHEWABLE_TABLET | ORAL | Status: AC
  Filled 2017-07-10: qty 1

## 2017-07-10 MED ORDER — ONDANSETRON HCL 4 MG/2ML IJ SOLN: 4.0000 mg | Freq: Four times a day (QID) | INTRAMUSCULAR | Status: DC | PRN

## 2017-07-10 MED ORDER — SODIUM CHLORIDE 0.9 % IV SOLN: 250.0000 mL | INTRAVENOUS | Status: DC | PRN

## 2017-07-10 MED ORDER — MIDAZOLAM HCL 2 MG/2ML IJ SOLN
INTRAMUSCULAR | Status: AC
  Filled 2017-07-10: qty 2

## 2017-07-10 MED ORDER — LIDOCAINE HCL (PF) 1 % IJ SOLN
INTRAMUSCULAR | Status: AC
  Filled 2017-07-10: qty 30

## 2017-07-10 MED ORDER — CLOPIDOGREL BISULFATE 75 MG PO TABS
ORAL_TABLET | ORAL | Status: AC
  Filled 2017-07-10: qty 1

## 2017-07-10 MED ORDER — ACETAMINOPHEN 325 MG PO TABS
650.0000 mg | ORAL_TABLET | ORAL | Status: DC | PRN
Start: 2017-07-10 — End: 2017-07-10

## 2017-07-10 MED ORDER — IOPAMIDOL (ISOVUE-370) INJECTION 76%
INTRAVENOUS | Status: AC
  Filled 2017-07-10: qty 50

## 2017-07-10 SURGICAL SUPPLY — 7 items
CATH INFINITI 5FR MULTPACK ANG (CATHETERS) ×2 IMPLANT
KIT HEART LEFT (KITS) ×2 IMPLANT
PACK CARDIAC CATHETERIZATION (CUSTOM PROCEDURE TRAY) ×2 IMPLANT
SHEATH PINNACLE 5F 10CM (SHEATH) ×2 IMPLANT
SYR MEDRAD MARK V 150ML (SYRINGE) ×2 IMPLANT
TRANSDUCER W/STOPCOCK (MISCELLANEOUS) ×2 IMPLANT
WIRE EMERALD 3MM-J .035X150CM (WIRE) ×2 IMPLANT

## 2017-07-10 NOTE — H&P (Signed)
Printed H&P in the chart needs to be scanned 

## 2017-07-10 NOTE — Discharge Instructions (Signed)

## 2017-07-10 NOTE — Progress Notes (Addendum)
Site area:RFA  Site Prior to Removal:  Level 0 Pressure Applied For: 20 min Manual:   yes Patient Status During Pull:  stable Post Pull Site:  Level 0 Post Pull Instructions Given:  yes Post Pull Pulses Present: palpable Dressing Applied:  tegaderm Bedrest begins @ 7282 till 1305 Comments:

## 2017-07-10 NOTE — Interval H&P Note (Signed)
Cath Lab Visit (complete for each Cath Lab visit)  Clinical Evaluation Leading to the Procedure:   ACS: No.  Non-ACS:    Anginal Classification: CCS III  Anti-ischemic medical therapy: Maximal Therapy (2 or more classes of medications)  Non-Invasive Test Results: Low-risk stress test findings: cardiac mortality <1%/year  Prior CABG: No previous CABG      History and Physical Interval Note:  07/10/2017 7:32 AM  Elaine Oconnell  has presented today for surgery, with the diagnosis of abnormal stress test - cp  The various methods of treatment have been discussed with the patient and family. After consideration of risks, benefits and other options for treatment, the patient has consented to  Procedure(s): LEFT HEART CATH AND CORONARY ANGIOGRAPHY (N/A) as a surgical intervention .  The patient's history has been reviewed, patient examined, no change in status, stable for surgery.  I have reviewed the patient's chart and labs.  Questions were answered to the patient's satisfaction.     Charolette Forward

## 2017-07-18 DIAGNOSIS — K219 Gastro-esophageal reflux disease without esophagitis: Secondary | ICD-10-CM | POA: Diagnosis not present

## 2017-07-18 DIAGNOSIS — I2510 Atherosclerotic heart disease of native coronary artery without angina pectoris: Secondary | ICD-10-CM | POA: Diagnosis not present

## 2017-07-18 DIAGNOSIS — E785 Hyperlipidemia, unspecified: Secondary | ICD-10-CM | POA: Diagnosis not present

## 2017-07-18 DIAGNOSIS — I119 Hypertensive heart disease without heart failure: Secondary | ICD-10-CM | POA: Diagnosis not present

## 2017-07-18 DIAGNOSIS — I5020 Unspecified systolic (congestive) heart failure: Secondary | ICD-10-CM | POA: Diagnosis not present

## 2017-07-18 DIAGNOSIS — M1990 Unspecified osteoarthritis, unspecified site: Secondary | ICD-10-CM | POA: Diagnosis not present

## 2017-07-18 DIAGNOSIS — E119 Type 2 diabetes mellitus without complications: Secondary | ICD-10-CM | POA: Diagnosis not present

## 2017-08-06 DIAGNOSIS — Z1231 Encounter for screening mammogram for malignant neoplasm of breast: Secondary | ICD-10-CM | POA: Diagnosis not present

## 2017-08-21 DIAGNOSIS — L570 Actinic keratosis: Secondary | ICD-10-CM | POA: Diagnosis not present

## 2017-08-21 DIAGNOSIS — L821 Other seborrheic keratosis: Secondary | ICD-10-CM | POA: Diagnosis not present

## 2017-08-21 DIAGNOSIS — L820 Inflamed seborrheic keratosis: Secondary | ICD-10-CM | POA: Diagnosis not present

## 2017-09-30 DIAGNOSIS — E119 Type 2 diabetes mellitus without complications: Secondary | ICD-10-CM | POA: Diagnosis not present

## 2017-09-30 DIAGNOSIS — Z7984 Long term (current) use of oral hypoglycemic drugs: Secondary | ICD-10-CM | POA: Diagnosis not present

## 2017-09-30 DIAGNOSIS — I252 Old myocardial infarction: Secondary | ICD-10-CM | POA: Diagnosis not present

## 2017-09-30 DIAGNOSIS — K219 Gastro-esophageal reflux disease without esophagitis: Secondary | ICD-10-CM | POA: Diagnosis not present

## 2017-09-30 DIAGNOSIS — Z0000 Encounter for general adult medical examination without abnormal findings: Secondary | ICD-10-CM | POA: Diagnosis not present

## 2017-09-30 DIAGNOSIS — I10 Essential (primary) hypertension: Secondary | ICD-10-CM | POA: Diagnosis not present

## 2017-10-14 DIAGNOSIS — K219 Gastro-esophageal reflux disease without esophagitis: Secondary | ICD-10-CM | POA: Diagnosis not present

## 2017-10-14 DIAGNOSIS — Z7984 Long term (current) use of oral hypoglycemic drugs: Secondary | ICD-10-CM | POA: Diagnosis not present

## 2017-10-14 DIAGNOSIS — I252 Old myocardial infarction: Secondary | ICD-10-CM | POA: Diagnosis not present

## 2017-10-14 DIAGNOSIS — I10 Essential (primary) hypertension: Secondary | ICD-10-CM | POA: Diagnosis not present

## 2017-10-14 DIAGNOSIS — Z0000 Encounter for general adult medical examination without abnormal findings: Secondary | ICD-10-CM | POA: Diagnosis not present

## 2017-10-14 DIAGNOSIS — E119 Type 2 diabetes mellitus without complications: Secondary | ICD-10-CM | POA: Diagnosis not present

## 2017-10-17 DIAGNOSIS — I10 Essential (primary) hypertension: Secondary | ICD-10-CM | POA: Diagnosis not present

## 2017-10-17 DIAGNOSIS — I2510 Atherosclerotic heart disease of native coronary artery without angina pectoris: Secondary | ICD-10-CM | POA: Diagnosis not present

## 2017-10-17 DIAGNOSIS — E785 Hyperlipidemia, unspecified: Secondary | ICD-10-CM | POA: Diagnosis not present

## 2017-10-17 DIAGNOSIS — E119 Type 2 diabetes mellitus without complications: Secondary | ICD-10-CM | POA: Diagnosis not present

## 2017-10-17 DIAGNOSIS — K219 Gastro-esophageal reflux disease without esophagitis: Secondary | ICD-10-CM | POA: Diagnosis not present

## 2017-10-17 DIAGNOSIS — M1990 Unspecified osteoarthritis, unspecified site: Secondary | ICD-10-CM | POA: Diagnosis not present

## 2017-10-31 DIAGNOSIS — M79669 Pain in unspecified lower leg: Secondary | ICD-10-CM | POA: Diagnosis not present

## 2018-01-28 DIAGNOSIS — K219 Gastro-esophageal reflux disease without esophagitis: Secondary | ICD-10-CM | POA: Diagnosis not present

## 2018-01-28 DIAGNOSIS — I10 Essential (primary) hypertension: Secondary | ICD-10-CM | POA: Diagnosis not present

## 2018-01-28 DIAGNOSIS — I2510 Atherosclerotic heart disease of native coronary artery without angina pectoris: Secondary | ICD-10-CM | POA: Diagnosis not present

## 2018-01-28 DIAGNOSIS — M1990 Unspecified osteoarthritis, unspecified site: Secondary | ICD-10-CM | POA: Diagnosis not present

## 2018-03-30 DIAGNOSIS — Z658 Other specified problems related to psychosocial circumstances: Secondary | ICD-10-CM | POA: Diagnosis not present

## 2018-03-30 DIAGNOSIS — E1169 Type 2 diabetes mellitus with other specified complication: Secondary | ICD-10-CM | POA: Diagnosis not present

## 2018-03-30 DIAGNOSIS — Z23 Encounter for immunization: Secondary | ICD-10-CM | POA: Diagnosis not present

## 2018-05-25 DIAGNOSIS — J029 Acute pharyngitis, unspecified: Secondary | ICD-10-CM | POA: Diagnosis not present

## 2018-05-25 DIAGNOSIS — J020 Streptococcal pharyngitis: Secondary | ICD-10-CM | POA: Diagnosis not present

## 2018-06-10 DIAGNOSIS — I10 Essential (primary) hypertension: Secondary | ICD-10-CM | POA: Diagnosis not present

## 2018-06-10 DIAGNOSIS — M1990 Unspecified osteoarthritis, unspecified site: Secondary | ICD-10-CM | POA: Diagnosis not present

## 2018-06-10 DIAGNOSIS — E669 Obesity, unspecified: Secondary | ICD-10-CM | POA: Diagnosis not present

## 2018-06-10 DIAGNOSIS — I2510 Atherosclerotic heart disease of native coronary artery without angina pectoris: Secondary | ICD-10-CM | POA: Diagnosis not present

## 2018-06-10 DIAGNOSIS — K219 Gastro-esophageal reflux disease without esophagitis: Secondary | ICD-10-CM | POA: Diagnosis not present

## 2018-08-02 DIAGNOSIS — N8110 Cystocele, unspecified: Secondary | ICD-10-CM | POA: Diagnosis not present

## 2018-08-02 DIAGNOSIS — L309 Dermatitis, unspecified: Secondary | ICD-10-CM | POA: Diagnosis not present

## 2018-08-02 DIAGNOSIS — R300 Dysuria: Secondary | ICD-10-CM | POA: Diagnosis not present

## 2018-09-16 DIAGNOSIS — Z1231 Encounter for screening mammogram for malignant neoplasm of breast: Secondary | ICD-10-CM | POA: Diagnosis not present

## 2019-02-04 DIAGNOSIS — E785 Hyperlipidemia, unspecified: Secondary | ICD-10-CM | POA: Diagnosis not present

## 2019-02-04 DIAGNOSIS — I25118 Atherosclerotic heart disease of native coronary artery with other forms of angina pectoris: Secondary | ICD-10-CM | POA: Diagnosis not present

## 2019-02-04 DIAGNOSIS — M1990 Unspecified osteoarthritis, unspecified site: Secondary | ICD-10-CM | POA: Diagnosis not present

## 2019-02-04 DIAGNOSIS — E119 Type 2 diabetes mellitus without complications: Secondary | ICD-10-CM | POA: Diagnosis not present

## 2019-02-04 DIAGNOSIS — K219 Gastro-esophageal reflux disease without esophagitis: Secondary | ICD-10-CM | POA: Diagnosis not present

## 2019-02-04 DIAGNOSIS — I10 Essential (primary) hypertension: Secondary | ICD-10-CM | POA: Diagnosis not present

## 2019-02-04 DIAGNOSIS — R69 Illness, unspecified: Secondary | ICD-10-CM | POA: Diagnosis not present

## 2019-03-18 DIAGNOSIS — I2510 Atherosclerotic heart disease of native coronary artery without angina pectoris: Secondary | ICD-10-CM | POA: Diagnosis not present

## 2019-03-18 DIAGNOSIS — Z0000 Encounter for general adult medical examination without abnormal findings: Secondary | ICD-10-CM | POA: Diagnosis not present

## 2019-03-18 DIAGNOSIS — K219 Gastro-esophageal reflux disease without esophagitis: Secondary | ICD-10-CM | POA: Diagnosis not present

## 2019-03-18 DIAGNOSIS — M79673 Pain in unspecified foot: Secondary | ICD-10-CM | POA: Diagnosis not present

## 2019-03-18 DIAGNOSIS — Z23 Encounter for immunization: Secondary | ICD-10-CM | POA: Diagnosis not present

## 2019-03-18 DIAGNOSIS — E1169 Type 2 diabetes mellitus with other specified complication: Secondary | ICD-10-CM | POA: Diagnosis not present

## 2019-03-18 DIAGNOSIS — Z79899 Other long term (current) drug therapy: Secondary | ICD-10-CM | POA: Diagnosis not present

## 2019-03-18 DIAGNOSIS — Z1389 Encounter for screening for other disorder: Secondary | ICD-10-CM | POA: Diagnosis not present

## 2019-03-18 DIAGNOSIS — I10 Essential (primary) hypertension: Secondary | ICD-10-CM | POA: Diagnosis not present

## 2019-03-18 DIAGNOSIS — M1990 Unspecified osteoarthritis, unspecified site: Secondary | ICD-10-CM | POA: Diagnosis not present

## 2019-05-10 DIAGNOSIS — H2513 Age-related nuclear cataract, bilateral: Secondary | ICD-10-CM | POA: Diagnosis not present

## 2019-05-10 DIAGNOSIS — E119 Type 2 diabetes mellitus without complications: Secondary | ICD-10-CM | POA: Diagnosis not present

## 2019-05-10 DIAGNOSIS — H353131 Nonexudative age-related macular degeneration, bilateral, early dry stage: Secondary | ICD-10-CM | POA: Diagnosis not present

## 2019-05-18 DIAGNOSIS — I252 Old myocardial infarction: Secondary | ICD-10-CM | POA: Diagnosis not present

## 2019-05-18 DIAGNOSIS — E1169 Type 2 diabetes mellitus with other specified complication: Secondary | ICD-10-CM | POA: Diagnosis not present

## 2019-05-18 DIAGNOSIS — I2510 Atherosclerotic heart disease of native coronary artery without angina pectoris: Secondary | ICD-10-CM | POA: Diagnosis not present

## 2019-05-18 DIAGNOSIS — M1990 Unspecified osteoarthritis, unspecified site: Secondary | ICD-10-CM | POA: Diagnosis not present

## 2019-05-18 DIAGNOSIS — I10 Essential (primary) hypertension: Secondary | ICD-10-CM | POA: Diagnosis not present

## 2019-06-18 DIAGNOSIS — Z23 Encounter for immunization: Secondary | ICD-10-CM | POA: Diagnosis not present

## 2019-06-18 DIAGNOSIS — I10 Essential (primary) hypertension: Secondary | ICD-10-CM | POA: Diagnosis not present

## 2019-06-18 DIAGNOSIS — Z0000 Encounter for general adult medical examination without abnormal findings: Secondary | ICD-10-CM | POA: Diagnosis not present

## 2019-06-18 DIAGNOSIS — R7309 Other abnormal glucose: Secondary | ICD-10-CM | POA: Diagnosis not present

## 2019-06-18 DIAGNOSIS — K219 Gastro-esophageal reflux disease without esophagitis: Secondary | ICD-10-CM | POA: Diagnosis not present

## 2019-06-18 DIAGNOSIS — M1990 Unspecified osteoarthritis, unspecified site: Secondary | ICD-10-CM | POA: Diagnosis not present

## 2019-06-18 DIAGNOSIS — I2510 Atherosclerotic heart disease of native coronary artery without angina pectoris: Secondary | ICD-10-CM | POA: Diagnosis not present

## 2019-06-18 DIAGNOSIS — E1169 Type 2 diabetes mellitus with other specified complication: Secondary | ICD-10-CM | POA: Diagnosis not present

## 2019-06-18 DIAGNOSIS — Z79899 Other long term (current) drug therapy: Secondary | ICD-10-CM | POA: Diagnosis not present

## 2019-06-18 DIAGNOSIS — N183 Chronic kidney disease, stage 3 unspecified: Secondary | ICD-10-CM | POA: Diagnosis not present

## 2019-06-23 DIAGNOSIS — I252 Old myocardial infarction: Secondary | ICD-10-CM | POA: Diagnosis not present

## 2019-06-23 DIAGNOSIS — R69 Illness, unspecified: Secondary | ICD-10-CM | POA: Diagnosis not present

## 2019-06-23 DIAGNOSIS — M1990 Unspecified osteoarthritis, unspecified site: Secondary | ICD-10-CM | POA: Diagnosis not present

## 2019-06-23 DIAGNOSIS — I2510 Atherosclerotic heart disease of native coronary artery without angina pectoris: Secondary | ICD-10-CM | POA: Diagnosis not present

## 2019-06-23 DIAGNOSIS — I10 Essential (primary) hypertension: Secondary | ICD-10-CM | POA: Diagnosis not present

## 2019-06-23 DIAGNOSIS — E1169 Type 2 diabetes mellitus with other specified complication: Secondary | ICD-10-CM | POA: Diagnosis not present

## 2019-06-24 DIAGNOSIS — M1990 Unspecified osteoarthritis, unspecified site: Secondary | ICD-10-CM | POA: Diagnosis not present

## 2019-06-24 DIAGNOSIS — I25118 Atherosclerotic heart disease of native coronary artery with other forms of angina pectoris: Secondary | ICD-10-CM | POA: Diagnosis not present

## 2019-06-24 DIAGNOSIS — E785 Hyperlipidemia, unspecified: Secondary | ICD-10-CM | POA: Diagnosis not present

## 2019-06-24 DIAGNOSIS — I10 Essential (primary) hypertension: Secondary | ICD-10-CM | POA: Diagnosis not present

## 2019-06-24 DIAGNOSIS — E119 Type 2 diabetes mellitus without complications: Secondary | ICD-10-CM | POA: Diagnosis not present

## 2019-06-24 DIAGNOSIS — K219 Gastro-esophageal reflux disease without esophagitis: Secondary | ICD-10-CM | POA: Diagnosis not present

## 2019-06-24 DIAGNOSIS — R69 Illness, unspecified: Secondary | ICD-10-CM | POA: Diagnosis not present

## 2019-06-25 DIAGNOSIS — R69 Illness, unspecified: Secondary | ICD-10-CM | POA: Diagnosis not present

## 2019-06-25 DIAGNOSIS — G4700 Insomnia, unspecified: Secondary | ICD-10-CM | POA: Diagnosis not present

## 2019-07-19 DIAGNOSIS — I10 Essential (primary) hypertension: Secondary | ICD-10-CM | POA: Diagnosis not present

## 2019-07-19 DIAGNOSIS — E1169 Type 2 diabetes mellitus with other specified complication: Secondary | ICD-10-CM | POA: Diagnosis not present

## 2019-07-19 DIAGNOSIS — M1990 Unspecified osteoarthritis, unspecified site: Secondary | ICD-10-CM | POA: Diagnosis not present

## 2019-07-19 DIAGNOSIS — R69 Illness, unspecified: Secondary | ICD-10-CM | POA: Diagnosis not present

## 2019-07-19 DIAGNOSIS — I2510 Atherosclerotic heart disease of native coronary artery without angina pectoris: Secondary | ICD-10-CM | POA: Diagnosis not present

## 2019-07-19 DIAGNOSIS — I252 Old myocardial infarction: Secondary | ICD-10-CM | POA: Diagnosis not present

## 2019-08-16 DIAGNOSIS — R69 Illness, unspecified: Secondary | ICD-10-CM | POA: Diagnosis not present

## 2019-08-16 DIAGNOSIS — M1990 Unspecified osteoarthritis, unspecified site: Secondary | ICD-10-CM | POA: Diagnosis not present

## 2019-08-16 DIAGNOSIS — I2510 Atherosclerotic heart disease of native coronary artery without angina pectoris: Secondary | ICD-10-CM | POA: Diagnosis not present

## 2019-08-16 DIAGNOSIS — I10 Essential (primary) hypertension: Secondary | ICD-10-CM | POA: Diagnosis not present

## 2019-08-16 DIAGNOSIS — E1169 Type 2 diabetes mellitus with other specified complication: Secondary | ICD-10-CM | POA: Diagnosis not present

## 2019-08-16 DIAGNOSIS — I252 Old myocardial infarction: Secondary | ICD-10-CM | POA: Diagnosis not present

## 2019-09-16 DIAGNOSIS — I10 Essential (primary) hypertension: Secondary | ICD-10-CM | POA: Diagnosis not present

## 2019-09-16 DIAGNOSIS — E1169 Type 2 diabetes mellitus with other specified complication: Secondary | ICD-10-CM | POA: Diagnosis not present

## 2019-09-23 DIAGNOSIS — E1169 Type 2 diabetes mellitus with other specified complication: Secondary | ICD-10-CM | POA: Diagnosis not present

## 2019-09-23 DIAGNOSIS — I10 Essential (primary) hypertension: Secondary | ICD-10-CM | POA: Diagnosis not present

## 2019-09-23 DIAGNOSIS — I2510 Atherosclerotic heart disease of native coronary artery without angina pectoris: Secondary | ICD-10-CM | POA: Diagnosis not present

## 2019-09-23 DIAGNOSIS — R69 Illness, unspecified: Secondary | ICD-10-CM | POA: Diagnosis not present

## 2019-09-23 DIAGNOSIS — M1990 Unspecified osteoarthritis, unspecified site: Secondary | ICD-10-CM | POA: Diagnosis not present

## 2019-09-23 DIAGNOSIS — G4700 Insomnia, unspecified: Secondary | ICD-10-CM | POA: Diagnosis not present

## 2019-09-23 DIAGNOSIS — I252 Old myocardial infarction: Secondary | ICD-10-CM | POA: Diagnosis not present

## 2019-09-29 DIAGNOSIS — K219 Gastro-esophageal reflux disease without esophagitis: Secondary | ICD-10-CM | POA: Diagnosis not present

## 2019-09-29 DIAGNOSIS — I2510 Atherosclerotic heart disease of native coronary artery without angina pectoris: Secondary | ICD-10-CM | POA: Diagnosis not present

## 2019-09-29 DIAGNOSIS — R69 Illness, unspecified: Secondary | ICD-10-CM | POA: Diagnosis not present

## 2019-09-29 DIAGNOSIS — E785 Hyperlipidemia, unspecified: Secondary | ICD-10-CM | POA: Diagnosis not present

## 2019-09-29 DIAGNOSIS — M1990 Unspecified osteoarthritis, unspecified site: Secondary | ICD-10-CM | POA: Diagnosis not present

## 2019-09-29 DIAGNOSIS — R0789 Other chest pain: Secondary | ICD-10-CM | POA: Diagnosis not present

## 2019-09-29 DIAGNOSIS — E119 Type 2 diabetes mellitus without complications: Secondary | ICD-10-CM | POA: Diagnosis not present

## 2019-09-29 DIAGNOSIS — I10 Essential (primary) hypertension: Secondary | ICD-10-CM | POA: Diagnosis not present

## 2019-10-17 DIAGNOSIS — R300 Dysuria: Secondary | ICD-10-CM | POA: Diagnosis not present

## 2019-10-17 DIAGNOSIS — N3000 Acute cystitis without hematuria: Secondary | ICD-10-CM | POA: Diagnosis not present

## 2019-11-11 DIAGNOSIS — G4700 Insomnia, unspecified: Secondary | ICD-10-CM | POA: Diagnosis not present

## 2019-11-11 DIAGNOSIS — E1169 Type 2 diabetes mellitus with other specified complication: Secondary | ICD-10-CM | POA: Diagnosis not present

## 2019-11-11 DIAGNOSIS — I10 Essential (primary) hypertension: Secondary | ICD-10-CM | POA: Diagnosis not present

## 2019-11-11 DIAGNOSIS — I2510 Atherosclerotic heart disease of native coronary artery without angina pectoris: Secondary | ICD-10-CM | POA: Diagnosis not present

## 2019-11-11 DIAGNOSIS — R69 Illness, unspecified: Secondary | ICD-10-CM | POA: Diagnosis not present

## 2019-11-11 DIAGNOSIS — M1990 Unspecified osteoarthritis, unspecified site: Secondary | ICD-10-CM | POA: Diagnosis not present

## 2019-11-11 DIAGNOSIS — I252 Old myocardial infarction: Secondary | ICD-10-CM | POA: Diagnosis not present

## 2019-12-09 DIAGNOSIS — Z1231 Encounter for screening mammogram for malignant neoplasm of breast: Secondary | ICD-10-CM | POA: Diagnosis not present

## 2020-01-03 DIAGNOSIS — E785 Hyperlipidemia, unspecified: Secondary | ICD-10-CM | POA: Diagnosis not present

## 2020-01-03 DIAGNOSIS — I10 Essential (primary) hypertension: Secondary | ICD-10-CM | POA: Diagnosis not present

## 2020-01-03 DIAGNOSIS — I2510 Atherosclerotic heart disease of native coronary artery without angina pectoris: Secondary | ICD-10-CM | POA: Diagnosis not present

## 2020-01-27 DIAGNOSIS — I252 Old myocardial infarction: Secondary | ICD-10-CM | POA: Diagnosis not present

## 2020-01-27 DIAGNOSIS — I10 Essential (primary) hypertension: Secondary | ICD-10-CM | POA: Diagnosis not present

## 2020-01-27 DIAGNOSIS — G4700 Insomnia, unspecified: Secondary | ICD-10-CM | POA: Diagnosis not present

## 2020-01-27 DIAGNOSIS — M1990 Unspecified osteoarthritis, unspecified site: Secondary | ICD-10-CM | POA: Diagnosis not present

## 2020-01-27 DIAGNOSIS — R69 Illness, unspecified: Secondary | ICD-10-CM | POA: Diagnosis not present

## 2020-01-27 DIAGNOSIS — I2510 Atherosclerotic heart disease of native coronary artery without angina pectoris: Secondary | ICD-10-CM | POA: Diagnosis not present

## 2020-01-27 DIAGNOSIS — E1169 Type 2 diabetes mellitus with other specified complication: Secondary | ICD-10-CM | POA: Diagnosis not present

## 2020-02-02 DIAGNOSIS — J029 Acute pharyngitis, unspecified: Secondary | ICD-10-CM | POA: Diagnosis not present

## 2020-02-02 DIAGNOSIS — B974 Respiratory syncytial virus as the cause of diseases classified elsewhere: Secondary | ICD-10-CM | POA: Diagnosis not present

## 2020-02-02 DIAGNOSIS — U071 COVID-19: Secondary | ICD-10-CM | POA: Diagnosis not present

## 2020-02-02 DIAGNOSIS — R05 Cough: Secondary | ICD-10-CM | POA: Diagnosis not present

## 2020-02-08 DIAGNOSIS — G4700 Insomnia, unspecified: Secondary | ICD-10-CM | POA: Diagnosis not present

## 2020-02-08 DIAGNOSIS — R69 Illness, unspecified: Secondary | ICD-10-CM | POA: Diagnosis not present

## 2020-02-08 DIAGNOSIS — R609 Edema, unspecified: Secondary | ICD-10-CM | POA: Diagnosis not present

## 2020-02-08 DIAGNOSIS — I252 Old myocardial infarction: Secondary | ICD-10-CM | POA: Diagnosis not present

## 2020-02-08 DIAGNOSIS — M1990 Unspecified osteoarthritis, unspecified site: Secondary | ICD-10-CM | POA: Diagnosis not present

## 2020-02-08 DIAGNOSIS — I2510 Atherosclerotic heart disease of native coronary artery without angina pectoris: Secondary | ICD-10-CM | POA: Diagnosis not present

## 2020-02-08 DIAGNOSIS — E1169 Type 2 diabetes mellitus with other specified complication: Secondary | ICD-10-CM | POA: Diagnosis not present

## 2020-02-08 DIAGNOSIS — R05 Cough: Secondary | ICD-10-CM | POA: Diagnosis not present

## 2020-02-08 DIAGNOSIS — Z794 Long term (current) use of insulin: Secondary | ICD-10-CM | POA: Diagnosis not present

## 2020-02-08 DIAGNOSIS — I10 Essential (primary) hypertension: Secondary | ICD-10-CM | POA: Diagnosis not present

## 2020-03-23 DIAGNOSIS — I10 Essential (primary) hypertension: Secondary | ICD-10-CM | POA: Diagnosis not present

## 2020-03-23 DIAGNOSIS — G4700 Insomnia, unspecified: Secondary | ICD-10-CM | POA: Diagnosis not present

## 2020-03-23 DIAGNOSIS — R69 Illness, unspecified: Secondary | ICD-10-CM | POA: Diagnosis not present

## 2020-03-23 DIAGNOSIS — I2510 Atherosclerotic heart disease of native coronary artery without angina pectoris: Secondary | ICD-10-CM | POA: Diagnosis not present

## 2020-03-23 DIAGNOSIS — E1169 Type 2 diabetes mellitus with other specified complication: Secondary | ICD-10-CM | POA: Diagnosis not present

## 2020-03-23 DIAGNOSIS — Z23 Encounter for immunization: Secondary | ICD-10-CM | POA: Diagnosis not present

## 2020-03-23 DIAGNOSIS — K219 Gastro-esophageal reflux disease without esophagitis: Secondary | ICD-10-CM | POA: Diagnosis not present

## 2020-03-23 DIAGNOSIS — I252 Old myocardial infarction: Secondary | ICD-10-CM | POA: Diagnosis not present

## 2020-03-23 DIAGNOSIS — M1990 Unspecified osteoarthritis, unspecified site: Secondary | ICD-10-CM | POA: Diagnosis not present

## 2020-03-23 DIAGNOSIS — Z0000 Encounter for general adult medical examination without abnormal findings: Secondary | ICD-10-CM | POA: Diagnosis not present

## 2020-05-01 DIAGNOSIS — M1990 Unspecified osteoarthritis, unspecified site: Secondary | ICD-10-CM | POA: Diagnosis not present

## 2020-05-01 DIAGNOSIS — I2510 Atherosclerotic heart disease of native coronary artery without angina pectoris: Secondary | ICD-10-CM | POA: Diagnosis not present

## 2020-05-01 DIAGNOSIS — I10 Essential (primary) hypertension: Secondary | ICD-10-CM | POA: Diagnosis not present

## 2020-05-01 DIAGNOSIS — E785 Hyperlipidemia, unspecified: Secondary | ICD-10-CM | POA: Diagnosis not present

## 2020-05-02 DIAGNOSIS — M1990 Unspecified osteoarthritis, unspecified site: Secondary | ICD-10-CM | POA: Diagnosis not present

## 2020-05-02 DIAGNOSIS — I252 Old myocardial infarction: Secondary | ICD-10-CM | POA: Diagnosis not present

## 2020-05-02 DIAGNOSIS — R69 Illness, unspecified: Secondary | ICD-10-CM | POA: Diagnosis not present

## 2020-05-02 DIAGNOSIS — I2510 Atherosclerotic heart disease of native coronary artery without angina pectoris: Secondary | ICD-10-CM | POA: Diagnosis not present

## 2020-05-02 DIAGNOSIS — G4700 Insomnia, unspecified: Secondary | ICD-10-CM | POA: Diagnosis not present

## 2020-05-02 DIAGNOSIS — I10 Essential (primary) hypertension: Secondary | ICD-10-CM | POA: Diagnosis not present

## 2020-05-02 DIAGNOSIS — E1169 Type 2 diabetes mellitus with other specified complication: Secondary | ICD-10-CM | POA: Diagnosis not present

## 2020-05-30 DIAGNOSIS — I252 Old myocardial infarction: Secondary | ICD-10-CM | POA: Diagnosis not present

## 2020-05-30 DIAGNOSIS — I10 Essential (primary) hypertension: Secondary | ICD-10-CM | POA: Diagnosis not present

## 2020-05-30 DIAGNOSIS — E1169 Type 2 diabetes mellitus with other specified complication: Secondary | ICD-10-CM | POA: Diagnosis not present

## 2020-05-30 DIAGNOSIS — R69 Illness, unspecified: Secondary | ICD-10-CM | POA: Diagnosis not present

## 2020-05-30 DIAGNOSIS — I2510 Atherosclerotic heart disease of native coronary artery without angina pectoris: Secondary | ICD-10-CM | POA: Diagnosis not present

## 2020-05-30 DIAGNOSIS — M1990 Unspecified osteoarthritis, unspecified site: Secondary | ICD-10-CM | POA: Diagnosis not present

## 2020-05-30 DIAGNOSIS — K219 Gastro-esophageal reflux disease without esophagitis: Secondary | ICD-10-CM | POA: Diagnosis not present

## 2020-05-30 DIAGNOSIS — G4700 Insomnia, unspecified: Secondary | ICD-10-CM | POA: Diagnosis not present

## 2020-06-26 DIAGNOSIS — I252 Old myocardial infarction: Secondary | ICD-10-CM | POA: Diagnosis not present

## 2020-06-26 DIAGNOSIS — I10 Essential (primary) hypertension: Secondary | ICD-10-CM | POA: Diagnosis not present

## 2020-06-26 DIAGNOSIS — K219 Gastro-esophageal reflux disease without esophagitis: Secondary | ICD-10-CM | POA: Diagnosis not present

## 2020-06-26 DIAGNOSIS — E1169 Type 2 diabetes mellitus with other specified complication: Secondary | ICD-10-CM | POA: Diagnosis not present

## 2020-06-26 DIAGNOSIS — I2510 Atherosclerotic heart disease of native coronary artery without angina pectoris: Secondary | ICD-10-CM | POA: Diagnosis not present

## 2020-06-26 DIAGNOSIS — R69 Illness, unspecified: Secondary | ICD-10-CM | POA: Diagnosis not present

## 2020-06-26 DIAGNOSIS — M1990 Unspecified osteoarthritis, unspecified site: Secondary | ICD-10-CM | POA: Diagnosis not present

## 2020-06-26 DIAGNOSIS — G4700 Insomnia, unspecified: Secondary | ICD-10-CM | POA: Diagnosis not present

## 2020-07-20 DIAGNOSIS — Z7189 Other specified counseling: Secondary | ICD-10-CM | POA: Diagnosis not present

## 2020-07-20 DIAGNOSIS — Z20828 Contact with and (suspected) exposure to other viral communicable diseases: Secondary | ICD-10-CM | POA: Diagnosis not present

## 2020-07-20 DIAGNOSIS — J069 Acute upper respiratory infection, unspecified: Secondary | ICD-10-CM | POA: Diagnosis not present

## 2020-08-03 DIAGNOSIS — M1990 Unspecified osteoarthritis, unspecified site: Secondary | ICD-10-CM | POA: Diagnosis not present

## 2020-08-03 DIAGNOSIS — I10 Essential (primary) hypertension: Secondary | ICD-10-CM | POA: Diagnosis not present

## 2020-08-03 DIAGNOSIS — K219 Gastro-esophageal reflux disease without esophagitis: Secondary | ICD-10-CM | POA: Diagnosis not present

## 2020-08-03 DIAGNOSIS — G4700 Insomnia, unspecified: Secondary | ICD-10-CM | POA: Diagnosis not present

## 2020-08-03 DIAGNOSIS — E1169 Type 2 diabetes mellitus with other specified complication: Secondary | ICD-10-CM | POA: Diagnosis not present

## 2020-08-03 DIAGNOSIS — I2510 Atherosclerotic heart disease of native coronary artery without angina pectoris: Secondary | ICD-10-CM | POA: Diagnosis not present

## 2020-08-03 DIAGNOSIS — R69 Illness, unspecified: Secondary | ICD-10-CM | POA: Diagnosis not present

## 2020-08-03 DIAGNOSIS — I252 Old myocardial infarction: Secondary | ICD-10-CM | POA: Diagnosis not present

## 2020-08-07 DIAGNOSIS — E1169 Type 2 diabetes mellitus with other specified complication: Secondary | ICD-10-CM | POA: Diagnosis not present

## 2020-08-28 DIAGNOSIS — N3090 Cystitis, unspecified without hematuria: Secondary | ICD-10-CM | POA: Diagnosis not present

## 2020-08-29 DIAGNOSIS — I2510 Atherosclerotic heart disease of native coronary artery without angina pectoris: Secondary | ICD-10-CM | POA: Diagnosis not present

## 2020-08-29 DIAGNOSIS — R69 Illness, unspecified: Secondary | ICD-10-CM | POA: Diagnosis not present

## 2020-08-29 DIAGNOSIS — G4700 Insomnia, unspecified: Secondary | ICD-10-CM | POA: Diagnosis not present

## 2020-08-29 DIAGNOSIS — K219 Gastro-esophageal reflux disease without esophagitis: Secondary | ICD-10-CM | POA: Diagnosis not present

## 2020-08-29 DIAGNOSIS — E1169 Type 2 diabetes mellitus with other specified complication: Secondary | ICD-10-CM | POA: Diagnosis not present

## 2020-08-29 DIAGNOSIS — I252 Old myocardial infarction: Secondary | ICD-10-CM | POA: Diagnosis not present

## 2020-08-29 DIAGNOSIS — M1990 Unspecified osteoarthritis, unspecified site: Secondary | ICD-10-CM | POA: Diagnosis not present

## 2020-08-29 DIAGNOSIS — I10 Essential (primary) hypertension: Secondary | ICD-10-CM | POA: Diagnosis not present

## 2020-09-04 DIAGNOSIS — E1169 Type 2 diabetes mellitus with other specified complication: Secondary | ICD-10-CM | POA: Diagnosis not present

## 2020-09-04 DIAGNOSIS — I2510 Atherosclerotic heart disease of native coronary artery without angina pectoris: Secondary | ICD-10-CM | POA: Diagnosis not present

## 2020-09-04 DIAGNOSIS — K219 Gastro-esophageal reflux disease without esophagitis: Secondary | ICD-10-CM | POA: Diagnosis not present

## 2020-09-21 DIAGNOSIS — E1169 Type 2 diabetes mellitus with other specified complication: Secondary | ICD-10-CM | POA: Diagnosis not present

## 2020-09-28 DIAGNOSIS — E119 Type 2 diabetes mellitus without complications: Secondary | ICD-10-CM | POA: Diagnosis not present

## 2020-09-28 DIAGNOSIS — I2510 Atherosclerotic heart disease of native coronary artery without angina pectoris: Secondary | ICD-10-CM | POA: Diagnosis not present

## 2020-09-28 DIAGNOSIS — I10 Essential (primary) hypertension: Secondary | ICD-10-CM | POA: Diagnosis not present

## 2020-09-28 DIAGNOSIS — M1990 Unspecified osteoarthritis, unspecified site: Secondary | ICD-10-CM | POA: Diagnosis not present

## 2020-09-28 DIAGNOSIS — E785 Hyperlipidemia, unspecified: Secondary | ICD-10-CM | POA: Diagnosis not present

## 2020-10-05 DIAGNOSIS — E1169 Type 2 diabetes mellitus with other specified complication: Secondary | ICD-10-CM | POA: Diagnosis not present

## 2020-11-03 DIAGNOSIS — R69 Illness, unspecified: Secondary | ICD-10-CM | POA: Diagnosis not present

## 2020-11-03 DIAGNOSIS — E1169 Type 2 diabetes mellitus with other specified complication: Secondary | ICD-10-CM | POA: Diagnosis not present

## 2020-11-03 DIAGNOSIS — M1990 Unspecified osteoarthritis, unspecified site: Secondary | ICD-10-CM | POA: Diagnosis not present

## 2020-11-03 DIAGNOSIS — K219 Gastro-esophageal reflux disease without esophagitis: Secondary | ICD-10-CM | POA: Diagnosis not present

## 2020-11-03 DIAGNOSIS — I10 Essential (primary) hypertension: Secondary | ICD-10-CM | POA: Diagnosis not present

## 2020-11-03 DIAGNOSIS — G4700 Insomnia, unspecified: Secondary | ICD-10-CM | POA: Diagnosis not present

## 2020-11-03 DIAGNOSIS — I2510 Atherosclerotic heart disease of native coronary artery without angina pectoris: Secondary | ICD-10-CM | POA: Diagnosis not present

## 2020-11-24 DIAGNOSIS — E1169 Type 2 diabetes mellitus with other specified complication: Secondary | ICD-10-CM | POA: Diagnosis not present

## 2020-11-24 DIAGNOSIS — K219 Gastro-esophageal reflux disease without esophagitis: Secondary | ICD-10-CM | POA: Diagnosis not present

## 2020-11-24 DIAGNOSIS — I10 Essential (primary) hypertension: Secondary | ICD-10-CM | POA: Diagnosis not present

## 2020-11-24 DIAGNOSIS — I2510 Atherosclerotic heart disease of native coronary artery without angina pectoris: Secondary | ICD-10-CM | POA: Diagnosis not present

## 2020-11-24 DIAGNOSIS — R69 Illness, unspecified: Secondary | ICD-10-CM | POA: Diagnosis not present

## 2020-11-24 DIAGNOSIS — I252 Old myocardial infarction: Secondary | ICD-10-CM | POA: Diagnosis not present

## 2020-11-24 DIAGNOSIS — M1990 Unspecified osteoarthritis, unspecified site: Secondary | ICD-10-CM | POA: Diagnosis not present

## 2020-11-24 DIAGNOSIS — G4700 Insomnia, unspecified: Secondary | ICD-10-CM | POA: Diagnosis not present

## 2020-11-29 DIAGNOSIS — M65331 Trigger finger, right middle finger: Secondary | ICD-10-CM | POA: Diagnosis not present

## 2020-11-29 DIAGNOSIS — Z6837 Body mass index (BMI) 37.0-37.9, adult: Secondary | ICD-10-CM | POA: Diagnosis not present

## 2020-11-29 DIAGNOSIS — R5383 Other fatigue: Secondary | ICD-10-CM | POA: Diagnosis not present

## 2020-11-29 DIAGNOSIS — E669 Obesity, unspecified: Secondary | ICD-10-CM | POA: Diagnosis not present

## 2020-11-29 DIAGNOSIS — M159 Polyosteoarthritis, unspecified: Secondary | ICD-10-CM | POA: Diagnosis not present

## 2020-11-29 DIAGNOSIS — M2550 Pain in unspecified joint: Secondary | ICD-10-CM | POA: Diagnosis not present

## 2020-12-05 DIAGNOSIS — E1169 Type 2 diabetes mellitus with other specified complication: Secondary | ICD-10-CM | POA: Diagnosis not present

## 2020-12-25 DIAGNOSIS — E1169 Type 2 diabetes mellitus with other specified complication: Secondary | ICD-10-CM | POA: Diagnosis not present

## 2020-12-25 DIAGNOSIS — G4700 Insomnia, unspecified: Secondary | ICD-10-CM | POA: Diagnosis not present

## 2020-12-25 DIAGNOSIS — M1990 Unspecified osteoarthritis, unspecified site: Secondary | ICD-10-CM | POA: Diagnosis not present

## 2020-12-25 DIAGNOSIS — R69 Illness, unspecified: Secondary | ICD-10-CM | POA: Diagnosis not present

## 2020-12-25 DIAGNOSIS — I2510 Atherosclerotic heart disease of native coronary artery without angina pectoris: Secondary | ICD-10-CM | POA: Diagnosis not present

## 2020-12-25 DIAGNOSIS — I10 Essential (primary) hypertension: Secondary | ICD-10-CM | POA: Diagnosis not present

## 2020-12-25 DIAGNOSIS — K219 Gastro-esophageal reflux disease without esophagitis: Secondary | ICD-10-CM | POA: Diagnosis not present

## 2020-12-25 DIAGNOSIS — I252 Old myocardial infarction: Secondary | ICD-10-CM | POA: Diagnosis not present

## 2021-01-03 DIAGNOSIS — E1169 Type 2 diabetes mellitus with other specified complication: Secondary | ICD-10-CM | POA: Diagnosis not present

## 2021-01-04 DIAGNOSIS — E1169 Type 2 diabetes mellitus with other specified complication: Secondary | ICD-10-CM | POA: Diagnosis not present

## 2021-01-12 DIAGNOSIS — R300 Dysuria: Secondary | ICD-10-CM | POA: Diagnosis not present

## 2021-01-15 DIAGNOSIS — E669 Obesity, unspecified: Secondary | ICD-10-CM | POA: Diagnosis not present

## 2021-01-15 DIAGNOSIS — M65331 Trigger finger, right middle finger: Secondary | ICD-10-CM | POA: Diagnosis not present

## 2021-01-15 DIAGNOSIS — M2550 Pain in unspecified joint: Secondary | ICD-10-CM | POA: Diagnosis not present

## 2021-01-15 DIAGNOSIS — Z6837 Body mass index (BMI) 37.0-37.9, adult: Secondary | ICD-10-CM | POA: Diagnosis not present

## 2021-01-15 DIAGNOSIS — M159 Polyosteoarthritis, unspecified: Secondary | ICD-10-CM | POA: Diagnosis not present

## 2021-01-29 DIAGNOSIS — J029 Acute pharyngitis, unspecified: Secondary | ICD-10-CM | POA: Diagnosis not present

## 2021-01-29 DIAGNOSIS — R0981 Nasal congestion: Secondary | ICD-10-CM | POA: Diagnosis not present

## 2021-01-29 DIAGNOSIS — R519 Headache, unspecified: Secondary | ICD-10-CM | POA: Diagnosis not present

## 2021-01-29 DIAGNOSIS — Z03818 Encounter for observation for suspected exposure to other biological agents ruled out: Secondary | ICD-10-CM | POA: Diagnosis not present

## 2021-01-30 DIAGNOSIS — M1990 Unspecified osteoarthritis, unspecified site: Secondary | ICD-10-CM | POA: Diagnosis not present

## 2021-01-30 DIAGNOSIS — K219 Gastro-esophageal reflux disease without esophagitis: Secondary | ICD-10-CM | POA: Diagnosis not present

## 2021-01-30 DIAGNOSIS — E1169 Type 2 diabetes mellitus with other specified complication: Secondary | ICD-10-CM | POA: Diagnosis not present

## 2021-01-30 DIAGNOSIS — I2510 Atherosclerotic heart disease of native coronary artery without angina pectoris: Secondary | ICD-10-CM | POA: Diagnosis not present

## 2021-01-30 DIAGNOSIS — R69 Illness, unspecified: Secondary | ICD-10-CM | POA: Diagnosis not present

## 2021-01-30 DIAGNOSIS — G4700 Insomnia, unspecified: Secondary | ICD-10-CM | POA: Diagnosis not present

## 2021-01-30 DIAGNOSIS — I10 Essential (primary) hypertension: Secondary | ICD-10-CM | POA: Diagnosis not present

## 2021-01-30 DIAGNOSIS — I252 Old myocardial infarction: Secondary | ICD-10-CM | POA: Diagnosis not present

## 2021-02-01 DIAGNOSIS — E1169 Type 2 diabetes mellitus with other specified complication: Secondary | ICD-10-CM | POA: Diagnosis not present

## 2021-02-19 DIAGNOSIS — E119 Type 2 diabetes mellitus without complications: Secondary | ICD-10-CM | POA: Diagnosis not present

## 2021-02-19 DIAGNOSIS — H353131 Nonexudative age-related macular degeneration, bilateral, early dry stage: Secondary | ICD-10-CM | POA: Diagnosis not present

## 2021-02-19 DIAGNOSIS — H2513 Age-related nuclear cataract, bilateral: Secondary | ICD-10-CM | POA: Diagnosis not present

## 2021-03-07 DIAGNOSIS — E1169 Type 2 diabetes mellitus with other specified complication: Secondary | ICD-10-CM | POA: Diagnosis not present

## 2021-03-27 DIAGNOSIS — R69 Illness, unspecified: Secondary | ICD-10-CM | POA: Diagnosis not present

## 2021-03-27 DIAGNOSIS — K219 Gastro-esophageal reflux disease without esophagitis: Secondary | ICD-10-CM | POA: Diagnosis not present

## 2021-03-27 DIAGNOSIS — I10 Essential (primary) hypertension: Secondary | ICD-10-CM | POA: Diagnosis not present

## 2021-03-27 DIAGNOSIS — M1990 Unspecified osteoarthritis, unspecified site: Secondary | ICD-10-CM | POA: Diagnosis not present

## 2021-03-27 DIAGNOSIS — G4700 Insomnia, unspecified: Secondary | ICD-10-CM | POA: Diagnosis not present

## 2021-03-27 DIAGNOSIS — I2510 Atherosclerotic heart disease of native coronary artery without angina pectoris: Secondary | ICD-10-CM | POA: Diagnosis not present

## 2021-03-27 DIAGNOSIS — I252 Old myocardial infarction: Secondary | ICD-10-CM | POA: Diagnosis not present

## 2021-03-27 DIAGNOSIS — E1169 Type 2 diabetes mellitus with other specified complication: Secondary | ICD-10-CM | POA: Diagnosis not present

## 2021-03-28 DIAGNOSIS — I10 Essential (primary) hypertension: Secondary | ICD-10-CM | POA: Diagnosis not present

## 2021-03-28 DIAGNOSIS — E1169 Type 2 diabetes mellitus with other specified complication: Secondary | ICD-10-CM | POA: Diagnosis not present

## 2021-03-30 DIAGNOSIS — E1169 Type 2 diabetes mellitus with other specified complication: Secondary | ICD-10-CM | POA: Diagnosis not present

## 2021-04-03 DIAGNOSIS — G720 Drug-induced myopathy: Secondary | ICD-10-CM | POA: Diagnosis not present

## 2021-04-03 DIAGNOSIS — Z0000 Encounter for general adult medical examination without abnormal findings: Secondary | ICD-10-CM | POA: Diagnosis not present

## 2021-04-03 DIAGNOSIS — K219 Gastro-esophageal reflux disease without esophagitis: Secondary | ICD-10-CM | POA: Diagnosis not present

## 2021-04-03 DIAGNOSIS — E1169 Type 2 diabetes mellitus with other specified complication: Secondary | ICD-10-CM | POA: Diagnosis not present

## 2021-04-03 DIAGNOSIS — M1990 Unspecified osteoarthritis, unspecified site: Secondary | ICD-10-CM | POA: Diagnosis not present

## 2021-04-03 DIAGNOSIS — I10 Essential (primary) hypertension: Secondary | ICD-10-CM | POA: Diagnosis not present

## 2021-04-03 DIAGNOSIS — Z23 Encounter for immunization: Secondary | ICD-10-CM | POA: Diagnosis not present

## 2021-04-03 DIAGNOSIS — I252 Old myocardial infarction: Secondary | ICD-10-CM | POA: Diagnosis not present

## 2021-04-04 DIAGNOSIS — I10 Essential (primary) hypertension: Secondary | ICD-10-CM | POA: Diagnosis not present

## 2021-04-04 DIAGNOSIS — E785 Hyperlipidemia, unspecified: Secondary | ICD-10-CM | POA: Diagnosis not present

## 2021-04-04 DIAGNOSIS — I2510 Atherosclerotic heart disease of native coronary artery without angina pectoris: Secondary | ICD-10-CM | POA: Diagnosis not present

## 2021-04-05 DIAGNOSIS — E1169 Type 2 diabetes mellitus with other specified complication: Secondary | ICD-10-CM | POA: Diagnosis not present

## 2021-04-06 DIAGNOSIS — E1169 Type 2 diabetes mellitus with other specified complication: Secondary | ICD-10-CM | POA: Diagnosis not present

## 2021-04-18 DIAGNOSIS — I10 Essential (primary) hypertension: Secondary | ICD-10-CM | POA: Diagnosis not present

## 2021-04-18 DIAGNOSIS — M1990 Unspecified osteoarthritis, unspecified site: Secondary | ICD-10-CM | POA: Diagnosis not present

## 2021-04-18 DIAGNOSIS — R2689 Other abnormalities of gait and mobility: Secondary | ICD-10-CM | POA: Diagnosis not present

## 2021-04-18 DIAGNOSIS — E119 Type 2 diabetes mellitus without complications: Secondary | ICD-10-CM | POA: Diagnosis not present

## 2021-04-18 DIAGNOSIS — E785 Hyperlipidemia, unspecified: Secondary | ICD-10-CM | POA: Diagnosis not present

## 2021-04-18 DIAGNOSIS — I2510 Atherosclerotic heart disease of native coronary artery without angina pectoris: Secondary | ICD-10-CM | POA: Diagnosis not present

## 2021-04-24 DIAGNOSIS — H25813 Combined forms of age-related cataract, bilateral: Secondary | ICD-10-CM | POA: Diagnosis not present

## 2021-04-24 DIAGNOSIS — E119 Type 2 diabetes mellitus without complications: Secondary | ICD-10-CM | POA: Diagnosis not present

## 2021-04-24 DIAGNOSIS — H353131 Nonexudative age-related macular degeneration, bilateral, early dry stage: Secondary | ICD-10-CM | POA: Diagnosis not present

## 2021-04-26 DIAGNOSIS — I2510 Atherosclerotic heart disease of native coronary artery without angina pectoris: Secondary | ICD-10-CM | POA: Diagnosis not present

## 2021-04-26 DIAGNOSIS — R69 Illness, unspecified: Secondary | ICD-10-CM | POA: Diagnosis not present

## 2021-04-26 DIAGNOSIS — E1169 Type 2 diabetes mellitus with other specified complication: Secondary | ICD-10-CM | POA: Diagnosis not present

## 2021-04-26 DIAGNOSIS — G4700 Insomnia, unspecified: Secondary | ICD-10-CM | POA: Diagnosis not present

## 2021-04-26 DIAGNOSIS — I252 Old myocardial infarction: Secondary | ICD-10-CM | POA: Diagnosis not present

## 2021-04-26 DIAGNOSIS — K219 Gastro-esophageal reflux disease without esophagitis: Secondary | ICD-10-CM | POA: Diagnosis not present

## 2021-04-26 DIAGNOSIS — M1990 Unspecified osteoarthritis, unspecified site: Secondary | ICD-10-CM | POA: Diagnosis not present

## 2021-04-26 DIAGNOSIS — I10 Essential (primary) hypertension: Secondary | ICD-10-CM | POA: Diagnosis not present

## 2021-05-07 DIAGNOSIS — E1169 Type 2 diabetes mellitus with other specified complication: Secondary | ICD-10-CM | POA: Diagnosis not present

## 2021-05-16 DIAGNOSIS — H25811 Combined forms of age-related cataract, right eye: Secondary | ICD-10-CM | POA: Diagnosis not present

## 2021-05-17 DIAGNOSIS — H25812 Combined forms of age-related cataract, left eye: Secondary | ICD-10-CM | POA: Diagnosis not present

## 2021-05-24 DIAGNOSIS — G4700 Insomnia, unspecified: Secondary | ICD-10-CM | POA: Diagnosis not present

## 2021-05-24 DIAGNOSIS — M1990 Unspecified osteoarthritis, unspecified site: Secondary | ICD-10-CM | POA: Diagnosis not present

## 2021-05-24 DIAGNOSIS — K219 Gastro-esophageal reflux disease without esophagitis: Secondary | ICD-10-CM | POA: Diagnosis not present

## 2021-05-24 DIAGNOSIS — I2510 Atherosclerotic heart disease of native coronary artery without angina pectoris: Secondary | ICD-10-CM | POA: Diagnosis not present

## 2021-05-24 DIAGNOSIS — E1169 Type 2 diabetes mellitus with other specified complication: Secondary | ICD-10-CM | POA: Diagnosis not present

## 2021-05-24 DIAGNOSIS — I252 Old myocardial infarction: Secondary | ICD-10-CM | POA: Diagnosis not present

## 2021-05-24 DIAGNOSIS — R69 Illness, unspecified: Secondary | ICD-10-CM | POA: Diagnosis not present

## 2021-05-24 DIAGNOSIS — I10 Essential (primary) hypertension: Secondary | ICD-10-CM | POA: Diagnosis not present

## 2021-06-06 DIAGNOSIS — E1169 Type 2 diabetes mellitus with other specified complication: Secondary | ICD-10-CM | POA: Diagnosis not present

## 2021-06-06 DIAGNOSIS — H25812 Combined forms of age-related cataract, left eye: Secondary | ICD-10-CM | POA: Diagnosis not present

## 2021-07-06 DIAGNOSIS — E1169 Type 2 diabetes mellitus with other specified complication: Secondary | ICD-10-CM | POA: Diagnosis not present

## 2021-07-18 DIAGNOSIS — E119 Type 2 diabetes mellitus without complications: Secondary | ICD-10-CM | POA: Diagnosis not present

## 2021-07-18 DIAGNOSIS — I10 Essential (primary) hypertension: Secondary | ICD-10-CM | POA: Diagnosis not present

## 2021-07-18 DIAGNOSIS — E785 Hyperlipidemia, unspecified: Secondary | ICD-10-CM | POA: Diagnosis not present

## 2021-07-18 DIAGNOSIS — I2510 Atherosclerotic heart disease of native coronary artery without angina pectoris: Secondary | ICD-10-CM | POA: Diagnosis not present

## 2021-07-18 DIAGNOSIS — Z1231 Encounter for screening mammogram for malignant neoplasm of breast: Secondary | ICD-10-CM | POA: Diagnosis not present

## 2021-08-01 DIAGNOSIS — H353131 Nonexudative age-related macular degeneration, bilateral, early dry stage: Secondary | ICD-10-CM | POA: Diagnosis not present

## 2021-08-01 DIAGNOSIS — H524 Presbyopia: Secondary | ICD-10-CM | POA: Diagnosis not present

## 2021-08-15 DIAGNOSIS — H524 Presbyopia: Secondary | ICD-10-CM | POA: Diagnosis not present

## 2021-08-15 DIAGNOSIS — H5203 Hypermetropia, bilateral: Secondary | ICD-10-CM | POA: Diagnosis not present

## 2021-08-17 DIAGNOSIS — I2510 Atherosclerotic heart disease of native coronary artery without angina pectoris: Secondary | ICD-10-CM | POA: Diagnosis not present

## 2021-08-17 DIAGNOSIS — K219 Gastro-esophageal reflux disease without esophagitis: Secondary | ICD-10-CM | POA: Diagnosis not present

## 2021-08-17 DIAGNOSIS — E1169 Type 2 diabetes mellitus with other specified complication: Secondary | ICD-10-CM | POA: Diagnosis not present

## 2021-08-17 DIAGNOSIS — I10 Essential (primary) hypertension: Secondary | ICD-10-CM | POA: Diagnosis not present

## 2021-08-26 DIAGNOSIS — Z03818 Encounter for observation for suspected exposure to other biological agents ruled out: Secondary | ICD-10-CM | POA: Diagnosis not present

## 2021-08-26 DIAGNOSIS — J029 Acute pharyngitis, unspecified: Secondary | ICD-10-CM | POA: Diagnosis not present

## 2021-08-26 DIAGNOSIS — R6883 Chills (without fever): Secondary | ICD-10-CM | POA: Diagnosis not present

## 2021-08-26 DIAGNOSIS — J069 Acute upper respiratory infection, unspecified: Secondary | ICD-10-CM | POA: Diagnosis not present

## 2021-09-04 DIAGNOSIS — E1169 Type 2 diabetes mellitus with other specified complication: Secondary | ICD-10-CM | POA: Diagnosis not present

## 2021-10-02 DIAGNOSIS — Z6838 Body mass index (BMI) 38.0-38.9, adult: Secondary | ICD-10-CM | POA: Diagnosis not present

## 2021-10-02 DIAGNOSIS — K5792 Diverticulitis of intestine, part unspecified, without perforation or abscess without bleeding: Secondary | ICD-10-CM | POA: Diagnosis not present

## 2021-10-02 DIAGNOSIS — R5381 Other malaise: Secondary | ICD-10-CM | POA: Diagnosis not present

## 2021-10-02 DIAGNOSIS — E1169 Type 2 diabetes mellitus with other specified complication: Secondary | ICD-10-CM | POA: Diagnosis not present

## 2021-10-05 DIAGNOSIS — E1169 Type 2 diabetes mellitus with other specified complication: Secondary | ICD-10-CM | POA: Diagnosis not present

## 2021-10-17 DIAGNOSIS — E785 Hyperlipidemia, unspecified: Secondary | ICD-10-CM | POA: Diagnosis not present

## 2021-10-17 DIAGNOSIS — I2510 Atherosclerotic heart disease of native coronary artery without angina pectoris: Secondary | ICD-10-CM | POA: Diagnosis not present

## 2021-10-17 DIAGNOSIS — E119 Type 2 diabetes mellitus without complications: Secondary | ICD-10-CM | POA: Diagnosis not present

## 2021-10-17 DIAGNOSIS — I10 Essential (primary) hypertension: Secondary | ICD-10-CM | POA: Diagnosis not present

## 2021-11-04 DIAGNOSIS — E1169 Type 2 diabetes mellitus with other specified complication: Secondary | ICD-10-CM | POA: Diagnosis not present

## 2021-11-14 DIAGNOSIS — K219 Gastro-esophageal reflux disease without esophagitis: Secondary | ICD-10-CM | POA: Diagnosis not present

## 2021-11-14 DIAGNOSIS — I2510 Atherosclerotic heart disease of native coronary artery without angina pectoris: Secondary | ICD-10-CM | POA: Diagnosis not present

## 2021-11-14 DIAGNOSIS — E1169 Type 2 diabetes mellitus with other specified complication: Secondary | ICD-10-CM | POA: Diagnosis not present

## 2021-11-14 DIAGNOSIS — G4700 Insomnia, unspecified: Secondary | ICD-10-CM | POA: Diagnosis not present

## 2021-11-14 DIAGNOSIS — M1990 Unspecified osteoarthritis, unspecified site: Secondary | ICD-10-CM | POA: Diagnosis not present

## 2022-01-03 DIAGNOSIS — L814 Other melanin hyperpigmentation: Secondary | ICD-10-CM | POA: Diagnosis not present

## 2022-01-03 DIAGNOSIS — L918 Other hypertrophic disorders of the skin: Secondary | ICD-10-CM | POA: Diagnosis not present

## 2022-01-03 DIAGNOSIS — L821 Other seborrheic keratosis: Secondary | ICD-10-CM | POA: Diagnosis not present

## 2022-01-03 DIAGNOSIS — D2372 Other benign neoplasm of skin of left lower limb, including hip: Secondary | ICD-10-CM | POA: Diagnosis not present

## 2022-01-03 DIAGNOSIS — D225 Melanocytic nevi of trunk: Secondary | ICD-10-CM | POA: Diagnosis not present

## 2022-01-03 DIAGNOSIS — L820 Inflamed seborrheic keratosis: Secondary | ICD-10-CM | POA: Diagnosis not present

## 2022-01-24 DIAGNOSIS — M1990 Unspecified osteoarthritis, unspecified site: Secondary | ICD-10-CM | POA: Diagnosis not present

## 2022-01-24 DIAGNOSIS — E119 Type 2 diabetes mellitus without complications: Secondary | ICD-10-CM | POA: Diagnosis not present

## 2022-01-24 DIAGNOSIS — I2510 Atherosclerotic heart disease of native coronary artery without angina pectoris: Secondary | ICD-10-CM | POA: Diagnosis not present

## 2022-01-24 DIAGNOSIS — K219 Gastro-esophageal reflux disease without esophagitis: Secondary | ICD-10-CM | POA: Diagnosis not present

## 2022-01-24 DIAGNOSIS — I10 Essential (primary) hypertension: Secondary | ICD-10-CM | POA: Diagnosis not present

## 2022-01-24 DIAGNOSIS — E1169 Type 2 diabetes mellitus with other specified complication: Secondary | ICD-10-CM | POA: Diagnosis not present

## 2022-02-15 DIAGNOSIS — R42 Dizziness and giddiness: Secondary | ICD-10-CM | POA: Diagnosis not present

## 2022-02-15 DIAGNOSIS — R1084 Generalized abdominal pain: Secondary | ICD-10-CM | POA: Diagnosis not present

## 2022-02-15 DIAGNOSIS — R109 Unspecified abdominal pain: Secondary | ICD-10-CM | POA: Diagnosis not present

## 2022-02-15 DIAGNOSIS — K5732 Diverticulitis of large intestine without perforation or abscess without bleeding: Secondary | ICD-10-CM | POA: Diagnosis not present

## 2022-02-15 DIAGNOSIS — K5792 Diverticulitis of intestine, part unspecified, without perforation or abscess without bleeding: Secondary | ICD-10-CM | POA: Diagnosis not present

## 2022-02-15 DIAGNOSIS — R1111 Vomiting without nausea: Secondary | ICD-10-CM | POA: Diagnosis not present

## 2022-02-15 DIAGNOSIS — Z743 Need for continuous supervision: Secondary | ICD-10-CM | POA: Diagnosis not present

## 2022-02-15 DIAGNOSIS — I10 Essential (primary) hypertension: Secondary | ICD-10-CM | POA: Diagnosis not present

## 2022-02-15 DIAGNOSIS — R0902 Hypoxemia: Secondary | ICD-10-CM | POA: Diagnosis not present

## 2022-02-15 DIAGNOSIS — R9431 Abnormal electrocardiogram [ECG] [EKG]: Secondary | ICD-10-CM | POA: Diagnosis not present

## 2022-02-17 DIAGNOSIS — R42 Dizziness and giddiness: Secondary | ICD-10-CM | POA: Diagnosis not present

## 2022-02-18 DIAGNOSIS — E1169 Type 2 diabetes mellitus with other specified complication: Secondary | ICD-10-CM | POA: Diagnosis not present

## 2022-02-18 DIAGNOSIS — K219 Gastro-esophageal reflux disease without esophagitis: Secondary | ICD-10-CM | POA: Diagnosis not present

## 2022-02-18 DIAGNOSIS — I10 Essential (primary) hypertension: Secondary | ICD-10-CM | POA: Diagnosis not present

## 2022-02-18 DIAGNOSIS — I2510 Atherosclerotic heart disease of native coronary artery without angina pectoris: Secondary | ICD-10-CM | POA: Diagnosis not present

## 2022-02-18 DIAGNOSIS — G4700 Insomnia, unspecified: Secondary | ICD-10-CM | POA: Diagnosis not present

## 2022-02-20 DIAGNOSIS — E119 Type 2 diabetes mellitus without complications: Secondary | ICD-10-CM | POA: Diagnosis not present

## 2022-02-20 DIAGNOSIS — I2510 Atherosclerotic heart disease of native coronary artery without angina pectoris: Secondary | ICD-10-CM | POA: Diagnosis not present

## 2022-02-20 DIAGNOSIS — I10 Essential (primary) hypertension: Secondary | ICD-10-CM | POA: Diagnosis not present

## 2022-02-20 DIAGNOSIS — E785 Hyperlipidemia, unspecified: Secondary | ICD-10-CM | POA: Diagnosis not present

## 2022-02-25 DIAGNOSIS — Z09 Encounter for follow-up examination after completed treatment for conditions other than malignant neoplasm: Secondary | ICD-10-CM | POA: Diagnosis not present

## 2022-02-25 DIAGNOSIS — K5792 Diverticulitis of intestine, part unspecified, without perforation or abscess without bleeding: Secondary | ICD-10-CM | POA: Diagnosis not present

## 2022-02-25 DIAGNOSIS — R42 Dizziness and giddiness: Secondary | ICD-10-CM | POA: Diagnosis not present

## 2022-04-01 DIAGNOSIS — I252 Old myocardial infarction: Secondary | ICD-10-CM | POA: Diagnosis not present

## 2022-04-01 DIAGNOSIS — I10 Essential (primary) hypertension: Secondary | ICD-10-CM | POA: Diagnosis not present

## 2022-04-01 DIAGNOSIS — E1169 Type 2 diabetes mellitus with other specified complication: Secondary | ICD-10-CM | POA: Diagnosis not present

## 2022-04-04 DIAGNOSIS — Z6839 Body mass index (BMI) 39.0-39.9, adult: Secondary | ICD-10-CM | POA: Diagnosis not present

## 2022-04-04 DIAGNOSIS — E1169 Type 2 diabetes mellitus with other specified complication: Secondary | ICD-10-CM | POA: Diagnosis not present

## 2022-04-04 DIAGNOSIS — R609 Edema, unspecified: Secondary | ICD-10-CM | POA: Diagnosis not present

## 2022-04-04 DIAGNOSIS — Z23 Encounter for immunization: Secondary | ICD-10-CM | POA: Diagnosis not present

## 2022-04-04 DIAGNOSIS — E7800 Pure hypercholesterolemia, unspecified: Secondary | ICD-10-CM | POA: Diagnosis not present

## 2022-04-04 DIAGNOSIS — Z0000 Encounter for general adult medical examination without abnormal findings: Secondary | ICD-10-CM | POA: Diagnosis not present

## 2022-04-04 DIAGNOSIS — N1831 Chronic kidney disease, stage 3a: Secondary | ICD-10-CM | POA: Diagnosis not present

## 2022-04-04 DIAGNOSIS — K219 Gastro-esophageal reflux disease without esophagitis: Secondary | ICD-10-CM | POA: Diagnosis not present

## 2022-04-04 DIAGNOSIS — I10 Essential (primary) hypertension: Secondary | ICD-10-CM | POA: Diagnosis not present

## 2022-05-22 DIAGNOSIS — E119 Type 2 diabetes mellitus without complications: Secondary | ICD-10-CM | POA: Diagnosis not present

## 2022-05-22 DIAGNOSIS — I10 Essential (primary) hypertension: Secondary | ICD-10-CM | POA: Diagnosis not present

## 2022-05-22 DIAGNOSIS — E785 Hyperlipidemia, unspecified: Secondary | ICD-10-CM | POA: Diagnosis not present

## 2022-05-22 DIAGNOSIS — I2510 Atherosclerotic heart disease of native coronary artery without angina pectoris: Secondary | ICD-10-CM | POA: Diagnosis not present

## 2022-07-30 DIAGNOSIS — E1169 Type 2 diabetes mellitus with other specified complication: Secondary | ICD-10-CM | POA: Diagnosis not present

## 2022-07-30 DIAGNOSIS — M1990 Unspecified osteoarthritis, unspecified site: Secondary | ICD-10-CM | POA: Diagnosis not present

## 2022-07-30 DIAGNOSIS — I10 Essential (primary) hypertension: Secondary | ICD-10-CM | POA: Diagnosis not present

## 2022-07-30 DIAGNOSIS — I2510 Atherosclerotic heart disease of native coronary artery without angina pectoris: Secondary | ICD-10-CM | POA: Diagnosis not present

## 2022-07-30 DIAGNOSIS — K219 Gastro-esophageal reflux disease without esophagitis: Secondary | ICD-10-CM | POA: Diagnosis not present

## 2022-10-03 DIAGNOSIS — E1169 Type 2 diabetes mellitus with other specified complication: Secondary | ICD-10-CM | POA: Diagnosis not present

## 2022-10-03 DIAGNOSIS — N1831 Chronic kidney disease, stage 3a: Secondary | ICD-10-CM | POA: Diagnosis not present

## 2022-10-03 DIAGNOSIS — R5383 Other fatigue: Secondary | ICD-10-CM | POA: Diagnosis not present

## 2022-10-03 DIAGNOSIS — R21 Rash and other nonspecific skin eruption: Secondary | ICD-10-CM | POA: Diagnosis not present

## 2022-10-03 DIAGNOSIS — G4710 Hypersomnia, unspecified: Secondary | ICD-10-CM | POA: Diagnosis not present

## 2022-10-03 DIAGNOSIS — Z6841 Body Mass Index (BMI) 40.0 and over, adult: Secondary | ICD-10-CM | POA: Diagnosis not present

## 2022-10-03 DIAGNOSIS — E7800 Pure hypercholesterolemia, unspecified: Secondary | ICD-10-CM | POA: Diagnosis not present

## 2022-10-03 DIAGNOSIS — E1122 Type 2 diabetes mellitus with diabetic chronic kidney disease: Secondary | ICD-10-CM | POA: Diagnosis not present

## 2022-12-25 DIAGNOSIS — F411 Generalized anxiety disorder: Secondary | ICD-10-CM | POA: Diagnosis not present

## 2022-12-25 DIAGNOSIS — I2510 Atherosclerotic heart disease of native coronary artery without angina pectoris: Secondary | ICD-10-CM | POA: Diagnosis not present

## 2022-12-25 DIAGNOSIS — E782 Mixed hyperlipidemia: Secondary | ICD-10-CM | POA: Diagnosis not present

## 2022-12-25 DIAGNOSIS — I10 Essential (primary) hypertension: Secondary | ICD-10-CM | POA: Diagnosis not present

## 2023-02-18 DIAGNOSIS — B370 Candidal stomatitis: Secondary | ICD-10-CM | POA: Diagnosis not present

## 2023-02-18 DIAGNOSIS — R399 Unspecified symptoms and signs involving the genitourinary system: Secondary | ICD-10-CM | POA: Diagnosis not present

## 2023-02-18 DIAGNOSIS — Z6841 Body Mass Index (BMI) 40.0 and over, adult: Secondary | ICD-10-CM | POA: Diagnosis not present

## 2023-04-10 DIAGNOSIS — Z1331 Encounter for screening for depression: Secondary | ICD-10-CM | POA: Diagnosis not present

## 2023-04-10 DIAGNOSIS — Z0000 Encounter for general adult medical examination without abnormal findings: Secondary | ICD-10-CM | POA: Diagnosis not present

## 2023-05-21 DIAGNOSIS — Z9989 Dependence on other enabling machines and devices: Secondary | ICD-10-CM | POA: Diagnosis not present

## 2023-05-21 DIAGNOSIS — R42 Dizziness and giddiness: Secondary | ICD-10-CM | POA: Diagnosis not present

## 2023-05-21 DIAGNOSIS — R0981 Nasal congestion: Secondary | ICD-10-CM | POA: Diagnosis not present

## 2023-05-21 DIAGNOSIS — F430 Acute stress reaction: Secondary | ICD-10-CM | POA: Diagnosis not present

## 2023-05-21 DIAGNOSIS — Z6839 Body mass index (BMI) 39.0-39.9, adult: Secondary | ICD-10-CM | POA: Diagnosis not present

## 2023-05-21 DIAGNOSIS — J392 Other diseases of pharynx: Secondary | ICD-10-CM | POA: Diagnosis not present

## 2023-06-26 DIAGNOSIS — I2510 Atherosclerotic heart disease of native coronary artery without angina pectoris: Secondary | ICD-10-CM | POA: Diagnosis not present

## 2023-06-26 DIAGNOSIS — E119 Type 2 diabetes mellitus without complications: Secondary | ICD-10-CM | POA: Diagnosis not present

## 2023-06-26 DIAGNOSIS — I10 Essential (primary) hypertension: Secondary | ICD-10-CM | POA: Diagnosis not present

## 2023-06-26 DIAGNOSIS — E782 Mixed hyperlipidemia: Secondary | ICD-10-CM | POA: Diagnosis not present

## 2023-08-24 DIAGNOSIS — J020 Streptococcal pharyngitis: Secondary | ICD-10-CM | POA: Diagnosis not present

## 2023-08-24 DIAGNOSIS — R051 Acute cough: Secondary | ICD-10-CM | POA: Diagnosis not present

## 2023-08-24 DIAGNOSIS — J101 Influenza due to other identified influenza virus with other respiratory manifestations: Secondary | ICD-10-CM | POA: Diagnosis not present

## 2023-09-25 DIAGNOSIS — I2510 Atherosclerotic heart disease of native coronary artery without angina pectoris: Secondary | ICD-10-CM | POA: Diagnosis not present

## 2023-09-25 DIAGNOSIS — I10 Essential (primary) hypertension: Secondary | ICD-10-CM | POA: Diagnosis not present

## 2023-09-25 DIAGNOSIS — E119 Type 2 diabetes mellitus without complications: Secondary | ICD-10-CM | POA: Diagnosis not present

## 2023-09-25 DIAGNOSIS — E782 Mixed hyperlipidemia: Secondary | ICD-10-CM | POA: Diagnosis not present

## 2023-10-01 DIAGNOSIS — R197 Diarrhea, unspecified: Secondary | ICD-10-CM | POA: Diagnosis not present

## 2023-10-01 DIAGNOSIS — M791 Myalgia, unspecified site: Secondary | ICD-10-CM | POA: Diagnosis not present

## 2023-10-01 DIAGNOSIS — Z6838 Body mass index (BMI) 38.0-38.9, adult: Secondary | ICD-10-CM | POA: Diagnosis not present

## 2023-10-01 DIAGNOSIS — R634 Abnormal weight loss: Secondary | ICD-10-CM | POA: Diagnosis not present

## 2023-11-04 DIAGNOSIS — R197 Diarrhea, unspecified: Secondary | ICD-10-CM | POA: Diagnosis not present

## 2023-11-10 DIAGNOSIS — R197 Diarrhea, unspecified: Secondary | ICD-10-CM | POA: Diagnosis not present

## 2023-11-10 DIAGNOSIS — D229 Melanocytic nevi, unspecified: Secondary | ICD-10-CM | POA: Diagnosis not present

## 2023-11-10 DIAGNOSIS — Z6837 Body mass index (BMI) 37.0-37.9, adult: Secondary | ICD-10-CM | POA: Diagnosis not present

## 2023-11-10 DIAGNOSIS — E1169 Type 2 diabetes mellitus with other specified complication: Secondary | ICD-10-CM | POA: Diagnosis not present

## 2023-12-08 DIAGNOSIS — Z961 Presence of intraocular lens: Secondary | ICD-10-CM | POA: Diagnosis not present

## 2023-12-08 DIAGNOSIS — E119 Type 2 diabetes mellitus without complications: Secondary | ICD-10-CM | POA: Diagnosis not present

## 2023-12-08 DIAGNOSIS — H524 Presbyopia: Secondary | ICD-10-CM | POA: Diagnosis not present

## 2023-12-08 DIAGNOSIS — H26493 Other secondary cataract, bilateral: Secondary | ICD-10-CM | POA: Diagnosis not present

## 2023-12-08 DIAGNOSIS — H353131 Nonexudative age-related macular degeneration, bilateral, early dry stage: Secondary | ICD-10-CM | POA: Diagnosis not present

## 2024-01-08 DIAGNOSIS — G629 Polyneuropathy, unspecified: Secondary | ICD-10-CM | POA: Diagnosis not present

## 2024-01-08 DIAGNOSIS — Z79899 Other long term (current) drug therapy: Secondary | ICD-10-CM | POA: Diagnosis not present

## 2024-01-15 DIAGNOSIS — H26493 Other secondary cataract, bilateral: Secondary | ICD-10-CM | POA: Diagnosis not present

## 2024-02-04 DIAGNOSIS — E119 Type 2 diabetes mellitus without complications: Secondary | ICD-10-CM | POA: Diagnosis not present

## 2024-02-04 DIAGNOSIS — I2510 Atherosclerotic heart disease of native coronary artery without angina pectoris: Secondary | ICD-10-CM | POA: Diagnosis not present

## 2024-02-04 DIAGNOSIS — E782 Mixed hyperlipidemia: Secondary | ICD-10-CM | POA: Diagnosis not present

## 2024-02-04 DIAGNOSIS — I10 Essential (primary) hypertension: Secondary | ICD-10-CM | POA: Diagnosis not present

## 2024-02-25 DIAGNOSIS — E785 Hyperlipidemia, unspecified: Secondary | ICD-10-CM | POA: Diagnosis not present

## 2024-02-25 DIAGNOSIS — E119 Type 2 diabetes mellitus without complications: Secondary | ICD-10-CM | POA: Diagnosis not present

## 2024-02-25 DIAGNOSIS — I10 Essential (primary) hypertension: Secondary | ICD-10-CM | POA: Diagnosis not present

## 2024-03-03 DIAGNOSIS — H26491 Other secondary cataract, right eye: Secondary | ICD-10-CM | POA: Diagnosis not present

## 2024-03-17 DIAGNOSIS — H26492 Other secondary cataract, left eye: Secondary | ICD-10-CM | POA: Diagnosis not present

## 2024-04-15 DIAGNOSIS — R300 Dysuria: Secondary | ICD-10-CM | POA: Diagnosis not present

## 2024-04-15 DIAGNOSIS — I10 Essential (primary) hypertension: Secondary | ICD-10-CM | POA: Diagnosis not present

## 2024-04-15 DIAGNOSIS — J029 Acute pharyngitis, unspecified: Secondary | ICD-10-CM | POA: Diagnosis not present

## 2024-04-15 DIAGNOSIS — N390 Urinary tract infection, site not specified: Secondary | ICD-10-CM | POA: Diagnosis not present

## 2024-04-27 DIAGNOSIS — H26493 Other secondary cataract, bilateral: Secondary | ICD-10-CM | POA: Diagnosis not present

## 2024-06-24 DIAGNOSIS — I2510 Atherosclerotic heart disease of native coronary artery without angina pectoris: Secondary | ICD-10-CM | POA: Diagnosis not present

## 2024-06-24 DIAGNOSIS — I10 Essential (primary) hypertension: Secondary | ICD-10-CM | POA: Diagnosis not present

## 2024-06-24 DIAGNOSIS — E119 Type 2 diabetes mellitus without complications: Secondary | ICD-10-CM | POA: Diagnosis not present

## 2024-06-24 DIAGNOSIS — E782 Mixed hyperlipidemia: Secondary | ICD-10-CM | POA: Diagnosis not present
